# Patient Record
Sex: Male | Born: 2000 | Race: Black or African American | Hispanic: No | Marital: Single | Smoking: Never smoker
Health system: Southern US, Community
[De-identification: ages and names within clinical notes are randomized; demographics above are authoritative.]

## PROBLEM LIST (undated history)

## (undated) DIAGNOSIS — J45909 Unspecified asthma, uncomplicated: Secondary | ICD-10-CM

## (undated) HISTORY — PX: NO PAST SURGERIES: SHX2092

---
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
## 2021-02-09 ENCOUNTER — Other Ambulatory Visit: Payer: Self-pay

## 2021-02-09 MED ORDER — TRIAMCINOLONE ACETONIDE 0.1 % EX OINT: 1.0000 "application " | TOPICAL_OINTMENT | Freq: Two times a day (BID) | CUTANEOUS | 1 refills | Status: AC

## 2021-03-17 ENCOUNTER — Ambulatory Visit (INDEPENDENT_AMBULATORY_CARE_PROVIDER_SITE_OTHER): Payer: 59 | Admitting: Adult Health Nurse Practitioner

## 2021-03-17 ENCOUNTER — Encounter: Payer: Self-pay | Admitting: Adult Health Nurse Practitioner

## 2021-03-17 ENCOUNTER — Other Ambulatory Visit: Payer: Self-pay

## 2021-03-17 VITALS — BP 118/70 | HR 88 | Temp 97.7°F | Wt 207.0 lb

## 2021-03-17 DIAGNOSIS — Z9109 Other allergy status, other than to drugs and biological substances: Secondary | ICD-10-CM

## 2021-03-17 DIAGNOSIS — Z113 Encounter for screening for infections with a predominantly sexual mode of transmission: Secondary | ICD-10-CM | POA: Diagnosis not present

## 2021-03-17 DIAGNOSIS — Z79899 Other long term (current) drug therapy: Secondary | ICD-10-CM

## 2021-03-17 NOTE — Progress Notes (Signed)
Assessment and Plan:  Diagnoses and all orders for this visit:  Environmental allergies Continue singulair Add zyrtec or xyzal daily for drying Take sudafed Q4-6 hours as needed for nasal congestion Saline nasal spray PRN  Screening examination for STD (sexually transmitted disease) -     C. trachomatis/N. gonorrhoeae RNA -     RPR -     HIV Antibody (routine testing w rflx)  Medication management Continued   Contact office with any new or worsening symptoms   Further disposition pending results of labs. Discussed med's effects and SE's.   Over 20 minutes of face to face interview, exam, counseling, chart review, and critical decision making was performed.   No future appointments.  ------------------------------------------------------------------------------------------------------------------   HPI 20 y.o.male presents for evaluation for sinus symptoms that start one week ago.  He reports is started with sinus congestion and sneezing.  Report th e sneezing is multiple times in a row.  He started taking singulair and mucinex and benadryl at night.  He reports these things helped temporarily.  He reports even after the medicine starts working he does not feel that good.    Patient would like to be tested for STI's.  Reports he is in a relationship and just wants to make sure.  He is asymptomatic and denies any known exposure.  Reports he has not been sexually active and denies multiple partners since his last relationship.  Reports his current partner is also getting tested.  Discussed safe sex practices.    Past Medical History:  Diagnosis Date  . Asthma      No Known Allergies  Current Outpatient Medications on File Prior to Visit  Medication Sig  . albuterol (VENTOLIN HFA) 108 (90 Base) MCG/ACT inhaler Inhale 1-2 puffs into the lungs every 6 (six) hours as needed for wheezing or shortness of breath.  Marland Kitchen ibuprofen (ADVIL) 800 MG tablet Take 1 tablet (800 mg total) by  mouth every 8 (eight) hours as needed for moderate pain.  . montelukast (SINGULAIR) 10 MG tablet Take 10 mg by mouth at bedtime.  . triamcinolone ointment (KENALOG) 0.1 % Apply 1 application topically 2 (two) times daily.  . cetirizine (ZYRTEC ALLERGY) 10 MG tablet Take 1 tablet (10 mg total) by mouth at bedtime. (Patient not taking: Reported on 03/17/2021)  . dexamethasone (DECADRON) 4 MG tablet Take 1 tab 3 x day - 3 days, then 2 x day - 3 days, then 1 tab daily   No current facility-administered medications on file prior to visit.    ROS: all negative except above.   Physical Exam:  BP 118/70   Pulse 88   Temp 97.7 F (36.5 C)   Wt 207 lb (93.9 kg)   SpO2 98%   General Appearance: Well nourished, in no apparent distress. Eyes: PERRLA, EOMs, conjunctiva no swelling or erythema Sinuses: No Frontal/maxillary tenderness ENT/Mouth: Ext aud canals clear, TMs without erythema, bulging. No erythema, swelling, or exudate on post pharynx.  Tonsils not swollen or erythematous. Hearing normal.  Neck: Supple, thyroid normal.  Respiratory: Respiratory effort normal, BS equal bilaterally without rales, rhonchi, wheezing or stridor.  Cardio: RRR with no MRGs. Brisk peripheral pulses without edema.  Abdomen: Soft, + BS.  Non tender, no guarding, rebound, hernias, masses. Lymphatics: Non tender without lymphadenopathy.  Musculoskeletal: Full ROM, 5/5 strength, normal gait.  Skin: Warm, dry without rashes, lesions, ecchymosis.  Neuro: Cranial nerves intact. Normal muscle tone, no cerebellar symptoms. Sensation intact.  Psych: Awake and oriented X  3, normal affect, Insight and Judgment appropriate.      Elder Negus, Edrick Oh, DNP [facility] 03/17/2021  3:45 PM

## 2021-03-17 NOTE — Patient Instructions (Addendum)
     Pick up sudafed from any pharmacy.  This is a nasal decongestant.  Add one of the antihistamines from below:  Allergy Symptoms / Runny Nose: Chose one  Zyrtec / Cetirizine Take 10mg  by mouth May cause drowsiness, take nightly Be sure to drink plenty of water If this is not effective, try Xyzal or Allegra  OR  Xyzal / Levocetirazine  Take 5mg  by mouth May cause drowsiness, take nightly Be sure to drink plenty of water If this is not effective try Allegra or Zyrtec  OR  Allegra / fexofenadine Take 180mg  by mouth daily If this is not effective try Zyrtec or Xyzal   *If you battle with chronic allergies you may need to change the antihistamine you currently use to find most effective.   You may continue taking Singular with one of the above antihistamines.   Once your nose is cleared from using sudafed, IF you start to have nasal congestion again, try using Flonase nasal spray daily.

## 2021-03-18 LAB — HIV ANTIBODY (ROUTINE TESTING W REFLEX): HIV 1&2 Ab, 4th Generation: NONREACTIVE

## 2021-03-23 ENCOUNTER — Other Ambulatory Visit: Payer: Self-pay

## 2021-03-23 ENCOUNTER — Encounter: Payer: Self-pay | Admitting: Adult Health

## 2021-03-23 ENCOUNTER — Ambulatory Visit (INDEPENDENT_AMBULATORY_CARE_PROVIDER_SITE_OTHER): Payer: 59 | Admitting: Adult Health

## 2021-03-23 VITALS — BP 110/66 | HR 109 | Temp 97.7°F | Wt 215.0 lb

## 2021-03-23 DIAGNOSIS — J00 Acute nasopharyngitis [common cold]: Secondary | ICD-10-CM

## 2021-03-23 DIAGNOSIS — Z9109 Other allergy status, other than to drugs and biological substances: Secondary | ICD-10-CM

## 2021-03-23 MED ORDER — ALBUTEROL SULFATE HFA 108 (90 BASE) MCG/ACT IN AERS: 1.0000 | INHALATION_SPRAY | Freq: Four times a day (QID) | RESPIRATORY_TRACT | 2 refills | Status: DC | PRN

## 2021-03-23 MED ORDER — FLUTICASONE PROPIONATE 50 MCG/ACT NA SUSP: 1.0000 | Freq: Two times a day (BID) | NASAL | 1 refills | Status: AC | PRN

## 2021-03-23 MED ORDER — PREDNISONE 20 MG PO TABS: ORAL_TABLET | ORAL | 0 refills | Status: DC

## 2021-03-23 NOTE — Patient Instructions (Addendum)
    Try nasal saline sprays/irrigation after coming in from out doors  Try flonase 1-2 sprays in each nostril twice daily as needed - start with at night only   Make sure vents aren't blowing on your face  Be sure to dust and vacuum under bed   Medicines you can use  Nasal congestion  Little Remedies saline spray (aerosol/mist)- can try this, it is in the kids section - pseudoephedrine (Sudafed)- behind the counter, do not use if you have high blood pressure, medicine that have -D in them.  - phenylephrine (Sudafed PE) -Dextormethorphan + chlorpheniramine (Coridcidin HBP)- okay if you have high blood pressure -Oxymetazoline (Afrin) nasal spray- LIMIT to 3 days -Saline nasal spray -Neti pot (used distilled or bottled water)  Ear pain/congestion  -pseudoephedrine (sudafed) - Nasonex/flonase nasal spray  Fever  -Acetaminophen (Tyelnol) -Ibuprofen (Advil, motrin, aleve)  Sore Throat  -Acetaminophen (Tyelnol) -Ibuprofen (Advil, motrin, aleve) -Drink a lot of water -Gargle with salt water - Rest your voice (don't talk) -Throat sprays -Cough drops  Body Aches  -Acetaminophen (Tyelnol) -Ibuprofen (Advil, motrin, aleve)  Headache  -Acetaminophen (Tyelnol) -Ibuprofen (Advil, motrin, aleve) - Exedrin, Exedrin Migraine  Allergy symptoms (cough, sneeze, runny nose, itchy eyes) -Claritin or loratadine cheapest but likely the weakest  -Zyrtec or certizine at night because it can make you sleepy -The strongest is allegra or fexafinadine  Cheapest at [employer], [employer], [employer]  Cough  -Dextromethorphan (Delsym)- medicine that has DM in it -Guafenesin (Mucinex/Robitussin) - cough drops - drink lots of water  Chest Congestion  -Guafenesin (Mucinex/Robitussin)  Red Itchy Eyes  - Naphcon-A  Upset Stomach  - Bland diet (nothing spicy, greasy, fried, and high acid foods like tomatoes, oranges, berries) -OKAY- cereal, bread, soup, crackers, rice -Eat smaller more frequent  meals -reduce caffeine, no alcohol -Loperamide (Imodium-AD) if diarrhea -Prevacid for heart burn  General health when sick  -Hydration -wash your hands frequently -keep surfaces clean -change pillow cases and sheets often -Get fresh air but do not exercise strenuously -Vitamin D, double up on it - Vitamin C -Zinc

## 2021-03-23 NOTE — Progress Notes (Signed)
Assessment and Plan:  Diagnoses and all orders for this visit:  Environmental allergies/ rhinitis Allergic hygiene reviewed Continue singulair, allegra D Will give short steroid taper Recommended saline nasal irrigations after going outdoors, then do flonase at night, PRN if flaring in AM for short term Discussed the importance of avoiding unnecessary antibiotic therapy. Follow up as needed. -     fluticasone (FLONASE) 50 MCG/ACT nasal spray; Place 1-2 sprays into both nostrils 2 (two) times daily as needed for allergies or rhinitis. -     predniSONE (DELTASONE) 20 MG tablet; 2 tablets daily for 3 days, 1 tablet daily for 4 days.   Asthma/wheezing Continue Singulair; PRN inhaler; will do steroid taper and control allergies -     albuterol (VENTOLIN HFA) 108 (90 Base) MCG/ACT inhaler; Inhale 1-2 puffs into the lungs every 6 (six) hours as needed for wheezing or shortness of breath.  Further disposition pending results of labs. Discussed med's effects and SE's.   Over 20 minutes of face to face interview, exam, counseling, chart review, and critical decision making was performed.   No future appointments.  ------------------------------------------------------------------------------------------------------------------   HPI BP 110/66   Pulse (!) 109   Temp 97.7 F (36.5 C)   Wt 215 lb (97.5 kg)   SpO2 96%   20 y.o.male with seasonal allergies presents for follow up on allergies/sinus symptoms. He had covid 30 Dec 2020. Was vaccinated with pfizer, 2/2. Denies known sick contacts.   He was evaluated 03/17/2021, running, nose congestion, was taking singulair, mucinex, benadryl at night; was advised start daily allegra- D, PRN sudafed which did help sx signfiicantly, but presents today feeling worse over the weekend, having more scratchy throat, fatigued, nasal congestion. Denies fever/chills, HA, denies sinus tenderness. Main concern is congestion worse at night. He has hx of allergies,  did have some wheezing a few nights ago. Requests albuterol refill. Denies pets in room, no carpet.   Past Medical History:  Diagnosis Date  . Asthma      No Known Allergies  Current Outpatient Medications on File Prior to Visit  Medication Sig  . cetirizine (ZYRTEC ALLERGY) 10 MG tablet Take 1 tablet (10 mg total) by mouth at bedtime.  Marland Kitchen ibuprofen (ADVIL) 800 MG tablet Take 1 tablet (800 mg total) by mouth every 8 (eight) hours as needed for moderate pain.  . montelukast (SINGULAIR) 10 MG tablet Take 10 mg by mouth at bedtime.  . triamcinolone ointment (KENALOG) 0.1 % Apply 1 application topically 2 (two) times daily.  Marland Kitchen albuterol (VENTOLIN HFA) 108 (90 Base) MCG/ACT inhaler Inhale 1-2 puffs into the lungs every 6 (six) hours as needed for wheezing or shortness of breath. (Patient not taking: Reported on 03/23/2021)  . dexamethasone (DECADRON) 4 MG tablet Take 1 tab 3 x day - 3 days, then 2 x day - 3 days, then 1 tab daily   No current facility-administered medications on file prior to visit.    Past Surgical History:  Procedure Laterality Date  . NO PAST SURGERIES    . TONSILLECTOMY AND ADENOIDECTOMY Bilateral 06/03/2019   Procedure: TONSILLECTOMY AND ADENOIDECTOMY;  Surgeon: Drema Halon, MD;  Location: [facility];  Service: ENT;  Laterality: Bilateral;   Surgical History:  He  has a past surgical history that includes No past surgeries and Tonsillectomy and adenoidectomy (Bilateral, 06/03/2019). Social History:   reports that he has never smoked. He has never used smokeless tobacco. He reports that he does not drink alcohol and does  not use drugs.   ROS: all negative except above.   Physical Exam:  BP 110/66   Pulse (!) 109   Temp 97.7 F (36.5 C)   Wt 215 lb (97.5 kg)   SpO2 96%   General Appearance: Well nourished, well dressed young AA male in no apparent distress. Eyes: PERRLA, EOMs, conjunctiva no swelling or erythema Sinuses: No  Frontal/maxillary tenderness ENT/Mouth: Ext aud canals clear, TMs without erythema, bulging. No erythema, swelling, or exudate on post pharynx. Hearing normal.  Neck: Supple, thyroid normal.  Respiratory: Respiratory effort normal, BS equal bilaterally without rales, rhonchi, wheezing or stridor.  Cardio: RRR with no MRGs.  Lymphatics: Non tender without lymphadenopathy.  Musculoskeletal:  normal gait.  Skin: Warm, dry without rashes, lesions, ecchymosis.  Neuro: Normal muscle tone Psych: Awake and oriented X 3, normal affect, Insight and Judgment appropriate.      Dan Maker, NP 5:52 PM [facility]

## 2021-05-27 ENCOUNTER — Emergency Department: Admit: 2021-05-27 | Discharge status: Absent without leave | Payer: Self-pay

## 2021-05-27 ENCOUNTER — Encounter: Payer: Self-pay | Admitting: Emergency Medicine

## 2021-05-27 ENCOUNTER — Other Ambulatory Visit: Payer: Self-pay

## 2021-05-27 ENCOUNTER — Emergency Department (INDEPENDENT_AMBULATORY_CARE_PROVIDER_SITE_OTHER): Payer: 59

## 2021-05-27 ENCOUNTER — Emergency Department (INDEPENDENT_AMBULATORY_CARE_PROVIDER_SITE_OTHER)
Admission: EM | Admit: 2021-05-27 | Discharge: 2021-05-27 | Disposition: A | Discharge status: Home / Self Care | Payer: 59 | Source: Home / Self Care | Attending: Family Medicine | Admitting: Family Medicine

## 2021-05-27 DIAGNOSIS — M940 Chondrocostal junction syndrome [Tietze]: Secondary | ICD-10-CM

## 2021-05-27 DIAGNOSIS — R079 Chest pain, unspecified: Secondary | ICD-10-CM | POA: Diagnosis not present

## 2021-05-27 NOTE — Discharge Instructions (Addendum)
Apply ice pack for 20 to 30 minutes, 2 to 3 times daily  Continue until pain and swelling decrease.  May take Ibuprofen 200mg , 4 tabs every 8 hours with food.

## 2021-05-27 NOTE — ED Provider Notes (Signed)
[facility]    CSN: [id number] Arrival date & time: 05/27/21  1317      History   Chief Complaint Chief Complaint  Patient presents with   Lump under left breast area    HPI Micheal Taylor is a 20 y.o. male.   Last night patient believes that he felt a lump beneath the skin of his left chest below the breast.  The area is sharp and painful when he palpates it.  The pain is not related to activity.  He recalls no injury.  He denies shortness of breath and pleuritic pain, and feels well otherwise.  The history is provided by the patient.   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 Authorizing Provider  albuterol (VENTOLIN HFA) 108 (90 Base) MCG/ACT inhaler Inhale 1-2 puffs into the lungs every 6 (six) hours as needed for wheezing or shortness of breath. 03/23/21  Yes Judd Gaudier, NP  cetirizine (ZYRTEC ALLERGY) 10 MG tablet Take 1 tablet (10 mg total) by mouth at bedtime. 10/20/20 10/20/21 Yes McClanahan, Bella Kennedy, NP  fluticasone (FLONASE) 50 MCG/ACT nasal spray Place 1-2 sprays into both nostrils 2 (two) times daily as needed for allergies or rhinitis. 03/23/21  Yes Judd Gaudier, NP  ibuprofen (ADVIL) 800 MG tablet Take 1 tablet (800 mg total) by mouth every 8 (eight) hours as needed for moderate pain. 10/20/20  Yes McClanahan, Kyra, NP  montelukast (SINGULAIR) 10 MG tablet Take 10 mg by mouth at  bedtime.   Yes [provider]  triamcinolone ointment (KENALOG) 0.1 % Apply 1 application topically 2 (two) times daily. 02/09/21  Yes McClanahan, Bella Kennedy, NP  predniSONE (DELTASONE) 20 MG tablet 2 tablets daily for 3 days, 1 tablet daily for 4 days. 03/23/21   Judd Gaudier, NP    Family History Family History  Problem Relation Age of Onset   Hypertension Mother    Hypertension Maternal Grandmother    Dementia Maternal Grandfather    Alzheimer's disease Paternal Grandfather     Social History Social History   Tobacco Use   Smoking status: Never   Smokeless tobacco: Never  Vaping Use   Vaping Use: Never used  Substance Use Topics   Alcohol use: No    Alcohol/week: 0.0 standard drinks   Drug use: No     Allergies   Patient has no known allergies.   Review of Systems Review of Systems  Constitutional:  Negative for activity change, appetite change, chills, diaphoresis, fatigue and fever.  HENT: Negative.    Eyes: Negative.   Respiratory:  Negative for cough, choking, chest tightness, shortness of breath, wheezing and stridor.   Cardiovascular:  Negative for palpitations and leg swelling.  Gastrointestinal: Negative.   Genitourinary: Negative.   Musculoskeletal:  Negative for joint swelling and myalgias.  Skin:  Negative for color change and rash.  Neurological:  Negative for headaches.  Hematological:  Negative for adenopathy.    Physical Exam Triage Vital Signs ED Triage Vitals  Enc Vitals Group     BP 05/27/21 1332 117/72     Pulse Rate 05/27/21 1332 78     Resp --      Temp 05/27/21 1332 99 F (37.2 C)     Temp Source 05/27/21 1332 Oral     SpO2 05/27/21 1332 98 %     Weight --      Height --      Head Circumference --      Peak Flow --      Pain Score 05/27/21 1334 6     Pain Loc --      Pain Edu? --      Excl. in GC? --    No data found.  Updated Vital Signs BP 117/72 (BP Location: Right Arm)   Pulse 78   Temp 99 F (37.2 C) (Oral)    SpO2 98%   Visual Acuity Right Eye Distance:   Left Eye Distance:   Bilateral Distance:    Right Eye Near:   Left Eye Near:    Bilateral Near:     Physical Exam Vitals and nursing note reviewed.  Constitutional:      General: He is not in acute distress.    Appearance: He is not ill-appearing.  HENT:     Head: Normocephalic.     Nose: Nose normal.     Mouth/Throat:     Pharynx: Oropharynx is clear.  Eyes:     Pupils: Pupils are equal, round, and reactive to light.  Cardiovascular:     Rate and Rhythm: Normal rate and regular rhythm.     Heart sounds: Normal heart sounds. No murmur heard.   No friction rub.  Pulmonary:     Breath sounds: Normal breath sounds.  Chest:     Chest wall: Tenderness present. No mass, swelling, crepitus or edema.       Comments: Left anterior chest has tenderness to palpation over ribs and intercostal muscles from beneath breast to the sternum as noted on diagram.  No subcutaneous nodule or mass palpated.   Abdominal:     Tenderness: There is no abdominal tenderness.  Musculoskeletal:     Cervical back: Neck supple.     Right lower leg: No edema.     Left lower leg: No edema.  Lymphadenopathy:     Cervical: No cervical adenopathy.  Skin:    General: Skin is warm and dry.     Findings: No rash.  Neurological:     Mental Status: He is alert and oriented to person, place, and time.     UC Treatments / Results  Labs (all labs ordered are listed, but only abnormal results are displayed) Labs Reviewed - No data to display  EKG   Radiology DG Chest 2 View  Result Date: 05/27/2021 CLINICAL DATA:  Left chest pain EXAM: CHEST - 2 VIEW COMPARISON:  06/09/2006 FINDINGS: The heart size and mediastinal contours are within normal limits. Both lungs are clear. The visualized skeletal structures are unremarkable. IMPRESSION: No active cardiopulmonary disease. Electronically Signed   By: Marlan Palau M.D.   On: 05/27/2021 14:36     Procedures Procedures (including critical care time)  Medications Ordered in UC Medications - No data to display  Initial Impression / Assessment and Plan / UC Course  I have reviewed the triage vital signs and the nursing notes.  Pertinent labs & imaging results  that were available during my care of the patient were reviewed by me and considered in my medical decision making (see chart for details).    Reassurance. Followup with Dr. Rodney [facility] ([facility]) if not improving about four weeks.   Final Clinical Impressions(s) / UC Diagnoses   Final diagnoses:  Costochondritis     Discharge Instructions      Apply ice pack for 20 to 30 minutes, 2 to 3 times daily  Continue until pain and swelling decrease.  May take Ibuprofen 200mg , 4 tabs every 8 hours with food.      ED Prescriptions   None       , MD 05/30/21 1042

## 2021-05-27 NOTE — ED Triage Notes (Signed)
Patient c/o feeling a lump under his left breast area.  Patient noticed it last night.  The area is painful, it comes and goes.  No injury to the area.

## 2021-07-20 NOTE — Progress Notes (Addendum)
Complete Physical  Assessment and Plan: Health Maintenance- Discussed STD testing, safe sex, alcohol and drug awareness, drinking and driving dangers, wearing a seat belt and general safety measures for young adult. Micheal Taylor was seen today for annual exam.  Diagnoses and all orders for this visit:  Encounter for general medical examination       - Due annually  Screening examination for STD (sexually transmitted disease) -     C. trachomatis/N. gonorrhoeae RNA -     HIV Antibody (routine testing w rflx) -     RPR  Screening for cardiovascular condition -     EKG 12-Lead  Screening, anemia, deficiency, iron -     CBC with Differential/Platelet  Screening for thyroid disorder -     TSH  Vitamin D deficiency -     Insurance does not cover a Vit D blood test.  Instructed to take Vit D3 5000 units daily  Screening, lipid -     Lipid panel  Screening for endocrine, metabolic and immunity disorder -     COMPLETE METABOLIC PANEL WITH GFR  Class 2 obesity        - Instructed to increase exercise, eat diet rich in fruits /vegetables/fiber, limit processed foods   Discussed med's effects and SE's. Screening labs and tests as requested with regular follow-up as recommended. Over 40 minutes of exam, counseling, chart review and critical decision making was performed   HPI  This very nice 20 y.o.male presents for complete physical.  Patient has no major health issues.  Patient reports no complaints at this time.  BMI is Body mass index is 36.51 kg/m., he has been working on diet and exercise. Wt Readings from Last 3 Encounters:  07/21/21 222 lb 12.8 oz (101.1 kg) (97 %, Z= 1.94)*  03/23/21 215 lb (97.5 kg) (96 %, Z= 1.81)*  03/17/21 207 lb (93.9 kg) (95 %, Z= 1.64)*   * Growth percentiles are based on CDC (Boys, 2-20 Years) data.    He does workout. Pt works at [employer] in [employer] 5 days a week Finally, patient has history of Vitamin D Deficiency and last vitamin D was  Lab  Results  Component Value Date   VD25OH 18 (L) 07/24/2015  .  Currently on supplementation  Current Medications:  Current Outpatient Medications on File Prior to Visit  Medication Sig Dispense Refill   albuterol (VENTOLIN HFA) 108 (90 Base) MCG/ACT inhaler Inhale 1-2 puffs into the lungs every 6 (six) hours as needed for wheezing or shortness of breath. 18 g 2   cetirizine (ZYRTEC ALLERGY) 10 MG tablet Take 1 tablet (10 mg total) by mouth at bedtime. 30 tablet 2   fluticasone (FLONASE) 50 MCG/ACT nasal spray Place 1-2 sprays into both nostrils 2 (two) times daily as needed for allergies or rhinitis. 16 g 1   ibuprofen (ADVIL) 800 MG tablet Take 1 tablet (800 mg total) by mouth every 8 (eight) hours as needed for moderate pain. 30 tablet 0   montelukast (SINGULAIR) 10 MG tablet Take 10 mg by mouth at bedtime.     predniSONE (DELTASONE) 20 MG tablet 2 tablets daily for 3 days, 1 tablet daily for 4 days. 10 tablet 0   triamcinolone ointment (KENALOG) 0.1 % Apply 1 application topically 2 (two) times daily. (Patient taking differently: Apply 1 application topically 2 (two) times daily. Using PRN) 80 g 1   No current facility-administered medications on file prior to visit.   Health Maintenance:   Immunization History  Administered Date(s) Administered   DTaP 05/03/2002, 07/12/2002, 09/13/2002, 06/18/2003, 04/03/2006   HPV 9-valent 10/10/2016, 11/29/2016, 04/05/2017   Hepatitis B 09/22/2002, 04/01/2002, 09/13/2002   HiB (PRP-OMP) 05/03/2002, 07/22/2002, 10/10/2002, 03/19/2003   IPV 05/03/2002, 07/12/2002, 12/18/2002, 04/03/2006   Influenza Inj Mdck Quad With Preservative 11/06/2017   Influenza Split 09/30/2014   Influenza, Seasonal, Injecte, Preservative Fre 10/10/2016   MMR 03/19/2003, 04/28/2005   Meningococcal Conjugate 07/01/2014   PFIZER(Purple Top)SARS-COV-2 Vaccination 02/20/2020, 03/16/2020   Pneumococcal-Unspecified 05/03/2002, 09/13/2002   Tdap 03/15/2013   Varicella 03/19/2003     TD/TDAP:2014 Influenza: 2018 Pneumovax: N/A HPV vaccines: 3/3 Covid: [place] 02/20/20, 03/16/20 Sexually Active: yes STD testing today Last Dental Exam: 2021 Dr. Orma Render dental Last Eye Exam None - no glasses  Allergies: No Known Allergies Medical History:  has Childhood asthma; Eczema; Environmental allergies; and Acne on their problem list. Surgical History:  He  has a past surgical history that includes No past surgeries and Tonsillectomy and adenoidectomy (Bilateral, 06/03/2019). Family History:  Hisfamily history includes Alzheimer's disease in his paternal grandfather; Dementia in his maternal grandfather; Hypertension in his maternal grandmother and mother. Social History:   reports that he has never smoked. He has never used smokeless tobacco. He reports that he does not drink alcohol and does not use drugs.  Review of Systems: Review of Systems  Constitutional:  Negative for chills and fever.  HENT:  Negative for congestion, hearing loss, nosebleeds, sinus pain and tinnitus.   Eyes:  Negative for blurred vision and double vision.  Respiratory:  Negative for cough, shortness of breath and wheezing.   Cardiovascular:  Negative for chest pain, palpitations, orthopnea and leg swelling.  Gastrointestinal:  Negative for abdominal pain, constipation, heartburn, nausea and vomiting.  Genitourinary:  Negative for dysuria and urgency.  Skin:  Negative for rash.  Neurological:  Negative for dizziness, tingling, weakness and headaches.  Endo/Heme/Allergies:  Does not bruise/bleed easily.  Psychiatric/Behavioral:  Negative for depression and suicidal ideas. The patient does not have insomnia.    Physical Exam: Estimated body mass index is 36.51 kg/m as calculated from the following:   Height as of this encounter: 5' 5.5" (1.664 m).   Weight as of this encounter: 222 lb 12.8 oz (101.1 kg). BP 120/62   Pulse 67   Temp 97.7 F (36.5 C)   Ht 5' 5.5" (1.664 m)   Wt 222 lb 12.8 oz  (101.1 kg)   SpO2 98%   BMI 36.51 kg/m  General Appearance: Well nourished, in no apparent distress.  Eyes: PERRLA, EOMs, conjunctiva no swelling or erythema, normal fundi and vessels.  Sinuses: No Frontal/maxillary tenderness  ENT/Mouth: Ext aud canals clear, normal light reflex with TMs without erythema, bulging. Good dentition. No erythema, swelling, or exudate on post pharynx. Tonsils not swollen or erythematous. Hearing normal.  Neck: Supple, thyroid normal. No bruits  Respiratory: Respiratory effort normal, BS equal bilaterally without rales, rhonchi, wheezing or stridor.  Cardio: RRR without murmurs, rubs or gallops. Brisk peripheral pulses without edema.  Chest: symmetric, with normal excursions and percussion.  Abdomen: Soft, nontender, no guarding, rebound, hernias, masses, or organomegaly.  Lymphatics: Non tender without lymphadenopathy.  Genitourinary: defer Musculoskeletal: Full ROM all peripheral extremities,5/5 strength, and normal gait.  Skin: Warm, dry without rashes, lesions, ecchymosis. Neuro: Cranial nerves intact, reflexes equal bilaterally. Normal muscle tone, no cerebellar symptoms. Sensation intact.  Psych: Awake and oriented X 3, normal affect, Insight and Judgment appropriate.   EKG: Normal sinus rhythm  Eva Vallee W Denna Fryberger 2:15  PM [facility]

## 2021-07-21 ENCOUNTER — Encounter: Payer: Self-pay | Admitting: Nurse Practitioner

## 2021-07-21 ENCOUNTER — Ambulatory Visit (INDEPENDENT_AMBULATORY_CARE_PROVIDER_SITE_OTHER): Payer: 59 | Admitting: Nurse Practitioner

## 2021-07-21 ENCOUNTER — Other Ambulatory Visit: Payer: Self-pay

## 2021-07-21 VITALS — BP 120/62 | HR 67 | Temp 97.7°F | Ht 65.5 in | Wt 222.8 lb

## 2021-07-21 DIAGNOSIS — Z13220 Encounter for screening for lipoid disorders: Secondary | ICD-10-CM

## 2021-07-21 DIAGNOSIS — I10 Essential (primary) hypertension: Secondary | ICD-10-CM

## 2021-07-21 DIAGNOSIS — Z113 Encounter for screening for infections with a predominantly sexual mode of transmission: Secondary | ICD-10-CM

## 2021-07-21 DIAGNOSIS — Z136 Encounter for screening for cardiovascular disorders: Secondary | ICD-10-CM

## 2021-07-21 DIAGNOSIS — E6609 Other obesity due to excess calories: Secondary | ICD-10-CM

## 2021-07-21 DIAGNOSIS — Z1329 Encounter for screening for other suspected endocrine disorder: Secondary | ICD-10-CM

## 2021-07-21 DIAGNOSIS — Z130 Encounter for screening for diseases of the blood and blood-forming organs and certain disorders involving the immune mechanism: Secondary | ICD-10-CM

## 2021-07-21 DIAGNOSIS — Z6836 Body mass index (BMI) 36.0-36.9, adult: Secondary | ICD-10-CM

## 2021-07-21 DIAGNOSIS — E559 Vitamin D deficiency, unspecified: Secondary | ICD-10-CM

## 2021-07-21 DIAGNOSIS — Z0000 Encounter for general adult medical examination without abnormal findings: Secondary | ICD-10-CM

## 2021-07-21 NOTE — Patient Instructions (Signed)

## 2021-07-21 NOTE — Addendum Note (Signed)
Addended by: Revonda Humphrey on: 07/21/2021 03:39 PM   Modules accepted: Orders

## 2021-07-22 LAB — COMPLETE METABOLIC PANEL WITH GFR
AG Ratio: 1.8 (calc) (ref 1.0–2.5)
ALT: 31 U/L (ref 8–46)
AST: 20 U/L (ref 12–32)
Albumin: 4.6 g/dL (ref 3.6–5.1)
Alkaline phosphatase (APISO): 54 U/L (ref 46–169)
BUN: 16 mg/dL (ref 7–20)
CO2: 27 mmol/L (ref 20–32)
Calcium: 9.8 mg/dL (ref 8.9–10.4)
Chloride: 103 mmol/L (ref 98–110)
Creat: 1.02 mg/dL (ref 0.60–1.24)
Globulin: 2.5 g/dL (calc) (ref 2.1–3.5)
Glucose, Bld: 100 mg/dL — ABNORMAL HIGH (ref 65–99)
Potassium: 4.4 mmol/L (ref 3.8–5.1)
Sodium: 138 mmol/L (ref 135–146)
Total Bilirubin: 0.9 mg/dL (ref 0.2–1.1)
Total Protein: 7.1 g/dL (ref 6.3–8.2)
eGFR: 109 mL/min/{1.73_m2} (ref >=60)

## 2021-07-22 LAB — CBC WITH DIFFERENTIAL/PLATELET
Absolute Monocytes: 489 cells/uL (ref 200–950)
Basophils Absolute: 31 cells/uL (ref 0–200)
Basophils Relative: 0.6 %
Eosinophils Absolute: 317 cells/uL (ref 15–500)
Eosinophils Relative: 6.1 %
HCT: 48.9 % (ref 38.5–50.0)
Hemoglobin: 16.3 g/dL (ref 13.2–17.1)
Lymphs Abs: 1695 cells/uL (ref 850–3900)
MCH: 29.4 pg (ref 27.0–33.0)
MCHC: 33.3 g/dL (ref 32.0–36.0)
MCV: 88.3 fL (ref 80.0–100.0)
MPV: 10.6 fL (ref 7.5–12.5)
Monocytes Relative: 9.4 %
Neutro Abs: 2668 cells/uL (ref 1500–7800)
Neutrophils Relative %: 51.3 %
Platelets: 294 10*3/uL (ref 140–400)
RBC: 5.54 10*6/uL (ref 4.20–5.80)
RDW: 12.7 % (ref 11.0–15.0)
Total Lymphocyte: 32.6 %
WBC: 5.2 10*3/uL (ref 3.8–10.8)

## 2021-07-22 LAB — LIPID PANEL
Cholesterol: 184 mg/dL — ABNORMAL HIGH (ref <170)
HDL: 55 mg/dL (ref >45)
LDL Cholesterol (Calc): 109 mg/dL (calc) (ref <110)
Non-HDL Cholesterol (Calc): 129 mg/dL (calc) — ABNORMAL HIGH (ref <120)
Total CHOL/HDL Ratio: 3.3 (calc) (ref <5.0)
Triglycerides: 103 mg/dL — ABNORMAL HIGH (ref <90)

## 2021-07-22 LAB — HIV ANTIBODY (ROUTINE TESTING W REFLEX): HIV 1&2 Ab, 4th Generation: NONREACTIVE

## 2021-07-22 LAB — C. TRACHOMATIS/N. GONORRHOEAE RNA
C. trachomatis RNA, TMA: NOT DETECTED
N. gonorrhoeae RNA, TMA: NOT DETECTED

## 2021-07-22 LAB — RPR: RPR Ser Ql: NONREACTIVE

## 2021-07-22 LAB — TSH: TSH: 0.53 mIU/L (ref 0.50–4.30)

## 2021-07-29 ENCOUNTER — Encounter: Payer: 59 | Admitting: Nurse Practitioner

## 2021-08-27 ENCOUNTER — Encounter: Payer: Self-pay | Admitting: Adult Health

## 2021-08-27 ENCOUNTER — Other Ambulatory Visit: Payer: Self-pay

## 2021-08-27 ENCOUNTER — Ambulatory Visit: Payer: 59 | Admitting: Adult Health

## 2021-08-27 VITALS — BP 112/70 | HR 94 | Temp 97.5°F | Resp 17 | Ht 65.5 in | Wt 220.8 lb

## 2021-08-27 DIAGNOSIS — Z1152 Encounter for screening for COVID-19: Secondary | ICD-10-CM | POA: Diagnosis not present

## 2021-08-27 DIAGNOSIS — J029 Acute pharyngitis, unspecified: Secondary | ICD-10-CM | POA: Diagnosis not present

## 2021-08-27 LAB — POC COVID19 BINAXNOW: SARS Coronavirus 2 Ag: NEGATIVE

## 2021-08-27 MED ORDER — PREDNISONE 20 MG PO TABS
ORAL_TABLET | ORAL | 0 refills | Status: DC
Start: 2021-08-27 — End: 2021-10-13

## 2021-08-27 MED ORDER — PROMETHAZINE-DM 6.25-15 MG/5ML PO SYRP: 5.0000 mL | ORAL_SOLUTION | Freq: Four times a day (QID) | ORAL | 1 refills | Status: DC | PRN

## 2021-08-27 NOTE — Patient Instructions (Signed)
  For sleep try 25-50 mg of benadryl 1 hour prior to bedtime as needed If this doesn't help try melatonin 5-15 mg      -Make sure you are drinking plenty of fluids to stay hydrated.    -while drinking fluids pinch and hold nose close and swallow, to help open eustachian tubes and drain fluid behind ear drums. -you can do salt water gargles. You can also do 1 TSP liquid Maalox and 1 TSP liquid benadryl- mix/ gargle/ spit  If you are not feeling better in 10-14 days, then please call the office.  Pharyngitis Pharyngitis is redness, pain, and swelling (inflammation) of your pharynx.   CAUSES   Pharyngitis is usually caused by infection. Most of the time, these infections are from viruses (viral) and are part of a cold. However, sometimes pharyngitis is caused by bacteria (bacterial). Pharyngitis can also be caused by allergies. Viral pharyngitis may be spread from person to person by coughing, sneezing, and personal items or utensils (cups, forks, spoons, toothbrushes). Bacterial pharyngitis may be spread from person to person by more intimate contact, such as kissing.   SIGNS AND SYMPTOMS   Symptoms of pharyngitis include:   Sore throat.   Tiredness (fatigue).   Low-grade fever.   Headache. Joint pain and muscle aches. Skin rashes. Swollen lymph nodes. Plaque-like film on throat or tonsils (often seen with bacterial pharyngitis). DIAGNOSIS   Your health care provider will ask you questions about your illness and your symptoms. Your medical history, along with a physical exam, is often all that is needed to diagnose pharyngitis. Sometimes, a rapid strep test is done. Other lab tests may also be done, depending on the suspected cause.   TREATMENT   Viral pharyngitis will usually get better in 3-4 days without the use of medicine. Bacterial pharyngitis is treated with medicines that kill germs (antibiotics).   HOME CARE INSTRUCTIONS   Drink enough water and fluids to keep your urine clear  or pale yellow.   Only take over-the-counter or prescription medicines as directed by your health care provider:   If you are prescribed antibiotics, make sure you finish them even if you start to feel better.   Do not take aspirin.   Get lots of rest.   Gargle with 8 oz of salt water ( tsp of salt per 1 qt of water) as often as every 1-2 hours to soothe your throat.   Throat lozenges (if you are not at risk for choking) or sprays may be used to soothe your throat. SEEK MEDICAL CARE IF:   You have large, tender lumps in your neck. You have a rash. You cough up green, yellow-brown, or bloody spit. SEEK IMMEDIATE MEDICAL CARE IF:   Your neck becomes stiff. You drool or are unable to swallow liquids. You vomit or are unable to keep medicines or liquids down. You have severe pain that does not go away with the use of recommended medicines. You have trouble breathing (not caused by a stuffy nose). MAKE SURE YOU:   Understand these instructions. Will watch your condition. Will get help right away if you are not doing well or get worse. Document Released: 11/28/2005 Document Revised: 09/18/2013 Document Reviewed: 08/05/2013 [facility] Patient Information 2015 [place], [place]. This information is not intended to replace advice given to you by your health care provider. Make sure you discuss any questions you have with your health care provider.

## 2021-08-27 NOTE — Progress Notes (Signed)
Assessment and Plan:  Micheal Taylor was seen today for sore throat.  Diagnoses and all orders for this visit:  Acute pharyngitis, unspecified etiology Bengin exam, suggestive of viral etiology Discussed importance of avoiding unnecessary abx Pharyngitis: increase fluids,  Salt water gargles, throat lozenges Start steroid PRN only if tylenol/ibuprofen and above insufficient for pain management over the weekend  Immune support reviewed; increase vitamin C, avoid sugar and dairy If symptoms do not improve in 5-7 days or get worse (fever, HA, very red throat/white spots) contact the office or go to UC.  -     POC COVID-Micheal - negative  -     predniSONE (DELTASONE) 20 MG tablet; 2 tablets daily for 3 days, 1 tablet daily for 4 days. -     promethazine-dextromethorphan (PROMETHAZINE-DM) 6.25-15 MG/5ML syrup; Take 5 mLs by mouth 4 (four) times daily as needed for cough.  Discussed med's effects and SE's.   Over 20 minutes of exam, counseling, chart review, and critical decision making was performed.   Future Appointments  Date Time Provider Department Center  07/21/2022  2:00 PM Revonda Humphrey, NP [facility] None    ------------------------------------------------------------------------------------------------------------------   HPI BP 112/70   Pulse 94   Temp (!) 97.5 F (36.4 C)   Resp 17   Ht 5' 5.5" (1.664 m)   Wt 220 lb 12.8 oz (100.2 kg)   SpO2 96%   BMI 36.18 kg/m  Micheal Taylor presents for evaluation of sore throat.   Scratchy throat started yesterday, sore towards the evening, denies fever, endorses mild chills. Denies HA, nasal congestion, rhinitis, watery/itchy eyes, sneezing.   Typically spring allergies   He reports has taken ibuprofen/tylenol and throat lozenges with mild improvement  No known sick contacts  Did have covid Micheal vaccines 2/2 in 2021, had covid Micheal early 2022, hasn't had booster yet.   S/p tonsillectomy    Past Medical History:  Diagnosis Date   Asthma       No Known Allergies  Current Outpatient Medications on File Prior to Visit  Medication Sig   albuterol (VENTOLIN HFA) 108 (90 Base) MCG/ACT inhaler Inhale 1-2 puffs into the lungs every 6 (six) hours as needed for wheezing or shortness of breath.   cetirizine (ZYRTEC ALLERGY) 10 MG tablet Take 1 tablet (10 mg total) by mouth at bedtime.   fluticasone (FLONASE) 50 MCG/ACT nasal spray Place 1-2 sprays into both nostrils 2 (two) times daily as needed for allergies or rhinitis.   ibuprofen (ADVIL) 800 MG tablet Take 1 tablet (800 mg total) by mouth every 8 (eight) hours as needed for moderate pain.   montelukast (SINGULAIR) 10 MG tablet Take 10 mg by mouth at bedtime.   triamcinolone ointment (KENALOG) 0.1 % Apply 1 application topically 2 (two) times daily. (Patient taking differently: Apply 1 application topically 2 (two) times daily. Using PRN)   predniSONE (DELTASONE) 20 MG tablet 2 tablets daily for 3 days, 1 tablet daily for 4 days. (Patient not taking: Reported on 08/27/2021)   No current facility-administered medications on file prior to visit.    ROS: all negative except above.   Physical Exam:  BP 112/70   Pulse 94   Temp (!) 97.5 F (36.4 C)   Resp 17   Ht 5' 5.5" (1.664 m)   Wt 220 lb 12.8 oz (100.2 kg)   SpO2 96%   BMI 36.18 kg/m   General Appearance: Well nourished, well dressed young adults male in no apparent distress. Eyes: PERRLA, conjunctiva  no swelling or erythema Sinuses: No Frontal/maxillary tenderness ENT/Mouth: Ext aud canals clear, TMs without erythema, bulging. No erythema, swelling, or exudate on post pharynx.  Tonsils absent. Hearing normal. No hoarseness.  Neck: Supple, thyroid normal.  Respiratory: Respiratory effort normal, BS equal bilaterally without rales, rhonchi, wheezing or stridor.  Cardio: RRR with no MRGs. Brisk peripheral pulses without edema.  Abdomen: Soft, + BS.  Non tender. Lymphatics: Non tender without lymphadenopathy.   Musculoskeletal: normal gait.  Skin: Warm, dry without rashes, lesions, ecchymosis.  Neuro: Normal muscle tone Psych: Awake and oriented X 3, normal affect, Insight and Judgment appropriate.     Micheal Maker, NP 10:45 AM [facility]

## 2021-10-12 NOTE — Progress Notes (Signed)
Assessment and Plan:   Micheal Taylor was seen today for acute visit.  Diagnoses and all orders for this visit:  Encounter for screening for COVID-19 -     POC COVID-19  Flu-like symptoms -     POCT Influenza A/B  Flu -     oseltamivir (TAMIFLU) 75 MG capsule; Take 1 capsule (75 mg total) by mouth 2 (two) times daily for 10 days. - Discussed the importance of avoiding unnecessary antibiotic therapy, no sign of pneumonia or other bacterial infection Suggested symptomatic OTC remedies, immune support Push fluids - likely dehydrated - alternate water and electrolyte supplemented drink if not eating Tylenol/ibuprofen for fever/headaches Present to ER if fever uncontrolled above 101 despite medication, dyspnea, CP, new or worsening symptoms Stay out for work until 24-48 hours after fever resolves - expect 5-7 day duration Follow up as needed.   Further disposition pending results of labs. Discussed med's effects and SE's.   Over 20 minutes of exam, counseling, chart review, and critical decision making was performed.   Future Appointments  Date Time Provider Department Center  07/21/2022  2:00 PM Revonda Humphrey, NP [facility] None    ------------------------------------------------------------------------------------------------------------------   HPI BP 112/68   Pulse (!) 112   Temp 99 F (37.2 C)   SpO2 95%  20 y.o.male presents for congestion.    States starting 2 days ago he began having body aches, congestion, nasal drainage-  brown, fever and diarrhea.  He has not used anything over the counter besides ibuprofen. Denies nausea, vomiting, and sore throat.    No Known Allergies  Current Outpatient Medications on File Prior to Visit  Medication Sig   albuterol (VENTOLIN HFA) 108 (90 Base) MCG/ACT inhaler Inhale 1-2 puffs into the lungs every 6 (six) hours as needed for wheezing or shortness of breath.   cetirizine (ZYRTEC ALLERGY) 10 MG tablet Take 1 tablet (10 mg total) by  mouth at bedtime.   fluticasone (FLONASE) 50 MCG/ACT nasal spray Place 1-2 sprays into both nostrils 2 (two) times daily as needed for allergies or rhinitis.   ibuprofen (ADVIL) 800 MG tablet Take 1 tablet (800 mg total) by mouth every 8 (eight) hours as needed for moderate pain.   montelukast (SINGULAIR) 10 MG tablet Take 10 mg by mouth at bedtime.   triamcinolone ointment (KENALOG) 0.1 % Apply 1 application topically 2 (two) times daily. (Patient taking differently: Apply 1 application topically 2 (two) times daily. Using PRN)   predniSONE (DELTASONE) 20 MG tablet 2 tablets daily for 3 days, 1 tablet daily for 4 days. (Patient not taking: Reported on 10/13/2021)   promethazine-dextromethorphan (PROMETHAZINE-DM) 6.25-15 MG/5ML syrup Take 5 mLs by mouth 4 (four) times daily as needed for cough. (Patient not taking: Reported on 10/13/2021)   No current facility-administered medications on file prior to visit.    ROS: all negative except above.   Physical Exam:  BP 112/68   Pulse (!) 112   Temp 99 F (37.2 C)   SpO2 95%   General Appearance: Well nourished, in no apparent distress. Eyes: PERRLA, EOMs, conjunctiva no swelling or erythema Sinuses: No Frontal/maxillary tenderness ENT/Mouth: Ext aud canals clear, TMs without erythema, bulging. Erythema of post pharynx no exudate.  Tonsils not swollen or erythematous. Hearing normal.  Neck: Supple, thyroid normal.  Respiratory: Respiratory effort normal, BS equal bilaterally without rales, rhonchi, wheezing or stridor.  Cardio: RRR with no MRGs. Brisk peripheral pulses without edema.  Abdomen: Soft, + BS.  Non tender, no guarding, rebound,  hernias, masses. Lymphatics: Non tender without lymphadenopathy.  Musculoskeletal: Full ROM, 5/5 strength, normal gait.  Skin: Warm, dry without rashes, lesions, ecchymosis.  Neuro: Cranial nerves intact. Normal muscle tone, no cerebellar symptoms. Sensation intact.  Psych: Awake and oriented X 3, normal  affect, Insight and Judgment appropriate.     Revonda Humphrey, NP 9:19 AM [facility]

## 2021-10-13 ENCOUNTER — Ambulatory Visit (INDEPENDENT_AMBULATORY_CARE_PROVIDER_SITE_OTHER): Payer: 59 | Admitting: Nurse Practitioner

## 2021-10-13 ENCOUNTER — Encounter: Payer: Self-pay | Admitting: Nurse Practitioner

## 2021-10-13 ENCOUNTER — Other Ambulatory Visit: Payer: Self-pay

## 2021-10-13 VITALS — BP 112/68 | HR 112 | Temp 99.0°F

## 2021-10-13 DIAGNOSIS — J111 Influenza due to unidentified influenza virus with other respiratory manifestations: Secondary | ICD-10-CM | POA: Diagnosis not present

## 2021-10-13 DIAGNOSIS — R6889 Other general symptoms and signs: Secondary | ICD-10-CM

## 2021-10-13 DIAGNOSIS — Z1152 Encounter for screening for COVID-19: Secondary | ICD-10-CM | POA: Diagnosis not present

## 2021-10-13 DIAGNOSIS — J45909 Unspecified asthma, uncomplicated: Secondary | ICD-10-CM

## 2021-10-13 LAB — POCT INFLUENZA A/B
Influenza A, POC: POSITIVE — AB
Influenza B, POC: NEGATIVE

## 2021-10-13 LAB — POC COVID19 BINAXNOW: SARS Coronavirus 2 Ag: NEGATIVE

## 2021-10-13 MED ORDER — ALBUTEROL SULFATE HFA 108 (90 BASE) MCG/ACT IN AERS: 1.0000 | INHALATION_SPRAY | Freq: Four times a day (QID) | RESPIRATORY_TRACT | 2 refills | Status: AC | PRN

## 2021-10-13 MED ORDER — OSELTAMIVIR PHOSPHATE 75 MG PO CAPS: 75.0000 mg | ORAL_CAPSULE | Freq: Two times a day (BID) | ORAL | 0 refills | Status: AC

## 2021-10-13 NOTE — Patient Instructions (Signed)
Influenza, Adult °Influenza, also called "the flu," is a viral infection that mainly affects the respiratory tract. This includes the lungs, nose, and throat. The flu spreads easily from person to person (is contagious). It causes common cold symptoms, along with high fever and body aches. °What are the causes? °This condition is caused by the influenza virus. You can get the virus by: °Breathing in droplets that are in the air from an infected person's cough or sneeze. °Touching something that has the virus on it (has been contaminated) and then touching your mouth, nose, or eyes. °What increases the risk? °The following factors may make you more likely to get the flu: °Not washing or sanitizing your hands often. °Having close contact with many people during cold and flu season. °Touching your mouth, eyes, or nose without first washing or sanitizing your hands. °Not getting an annual flu shot. °You may have a higher risk for the flu, including serious problems, such as a lung infection (pneumonia), if you: °Are older than 65. °Are pregnant. °Have a weakened disease-fighting system (immune system). This includes people who have HIV or AIDS, are on chemotherapy, or are taking medicines that reduce (suppress) the immune system. °Have a long-term (chronic) illness, such as heart disease, kidney disease, diabetes, or lung disease. °Have a liver disorder. °Are severely overweight (morbidly obese). °Have anemia. °Have asthma. °What are the signs or symptoms? °Symptoms of this condition usually begin suddenly and last 4-14 days. These may include: °Fever and chills. °Headaches, body aches, or muscle aches. °Sore throat. °Cough. °Runny or stuffy (congested) nose. °Chest discomfort. °Poor appetite. °Weakness or fatigue. °Dizziness. °Nausea or vomiting. °How is this diagnosed? °This condition may be diagnosed based on: °Your symptoms and medical history. °A physical exam. °Swabbing your nose or throat and testing the fluid  for the influenza virus. °How is this treated? °If the flu is diagnosed early, you can be treated with antiviral medicine that is given by mouth (orally) or through an IV. This can help reduce how severe the illness is and how long it lasts. °Taking care of yourself at home can help relieve symptoms. Your health care provider may recommend: °Taking over-the-counter medicines. °Drinking plenty of fluids. °In many cases, the flu goes away on its own. If you have severe symptoms or complications, you may be treated in a hospital. °Follow these instructions at home: °Activity °Rest as needed and get plenty of sleep. °Stay home from work or school as told by your health care provider. Unless you are visiting your health care provider, avoid leaving home until your fever has been gone for 24 hours without taking medicine. °Eating and drinking °Take an oral rehydration solution (ORS). This is a drink that is sold at pharmacies and retail stores. °Drink enough fluid to keep your urine pale yellow. °Drink clear fluids in small amounts as you are able. Clear fluids include water, ice chips, fruit juice mixed with water, and low-calorie sports drinks. °Eat bland, easy-to-digest foods in small amounts as you are able. These foods include bananas, applesauce, rice, lean meats, toast, and crackers. °Avoid drinking fluids that contain a lot of sugar or caffeine, such as energy drinks, regular sports drinks, and soda. °Avoid alcohol. °Avoid spicy or fatty foods. °General instructions °  °Take over-the-counter and prescription medicines only as told by your health care provider. °Use a cool mist humidifier to add humidity to the air in your home. This can make it easier to breathe. °When using a cool mist humidifier,   clean it daily. Empty the water and replace it with clean water. °Cover your mouth and nose when you cough or sneeze. °Wash your hands with soap and water often and for at least 20 seconds, especially after you cough or  sneeze. If soap and water are not available, use alcohol-based hand sanitizer. °Keep all follow-up visits. This is important. °How is this prevented? ° °Get an annual flu shot. This is usually available in late summer, fall, or winter. Ask your health care provider when you should get your flu shot. °Avoid contact with people who are sick during cold and flu season. This is generally fall and winter. °Contact a health care provider if: °You develop new symptoms. °You have: °Chest pain. °Diarrhea. °A fever. °Your cough gets worse. °You produce more mucus. °You feel nauseous or you vomit. °Get help right away if you: °Develop shortness of breath or have difficulty breathing. °Have skin or nails that turn a bluish color. °Have severe pain or stiffness in your neck. °Develop a sudden headache or sudden pain in your face or ear. °Cannot eat or drink without vomiting. °These symptoms may represent a serious problem that is an emergency. Do not wait to see if the symptoms will go away. Get medical help right away. Call your local emergency services (911 in the [place] Do not drive yourself to the hospital. °Summary °Influenza, also called "the flu," is a viral infection that primarily affects your respiratory tract. °Symptoms of the flu usually begin suddenly and last 4-14 days. °Getting an annual flu shot is the best way to prevent getting the flu. °Stay home from work or school as told by your health care provider. Unless you are visiting your health care provider, avoid leaving home until your fever has been gone for 24 hours without taking medicine. °Keep all follow-up visits. This is important. °This information is not intended to replace advice given to you by your health care provider. Make sure you discuss any questions you have with your health care provider. °Document Revised: 07/17/2020 Document Reviewed: 07/17/2020 °Elsevier Patient Education © 2022 [employer] ° °

## 2022-01-21 ENCOUNTER — Other Ambulatory Visit: Payer: Self-pay

## 2022-01-21 ENCOUNTER — Emergency Department
Admission: RE | Admit: 2022-01-21 | Discharge: 2022-01-21 | Disposition: A | Discharge status: Home / Self Care | Payer: 59 | Source: Ambulatory Visit

## 2022-01-21 ENCOUNTER — Emergency Department (INDEPENDENT_AMBULATORY_CARE_PROVIDER_SITE_OTHER): Payer: 59

## 2022-01-21 VITALS — BP 135/66 | HR 68 | Temp 98.4°F | Resp 16

## 2022-01-21 DIAGNOSIS — M25512 Pain in left shoulder: Secondary | ICD-10-CM | POA: Diagnosis not present

## 2022-01-21 MED ORDER — PREDNISONE 20 MG PO TABS: ORAL_TABLET | ORAL | 0 refills | Status: DC

## 2022-01-21 MED ORDER — BACLOFEN 10 MG PO TABS: 10.0000 mg | ORAL_TABLET | Freq: Three times a day (TID) | ORAL | 0 refills | Status: DC

## 2022-01-21 NOTE — Discharge Instructions (Addendum)
Advised patient to take medication as directed with food to completion.  Advised patient may use Baclofen daily or as needed for accompanying muscle spasms. Encouraged patient to avoid offending activities such as pulling, pushing, or lifting with the left shoulder for the next 7 to 10 days.  Encouraged patient to increase daily water intake while taking these medications.

## 2022-01-21 NOTE — ED Triage Notes (Signed)
Pt works in a [employer] and does a lot of heavy lifting and states his left shoulder and left knee have been hurting for weeks. Denies known injury but states the pain is getting worse.

## 2022-01-21 NOTE — ED Provider Notes (Signed)
[facility]    CSN: [id number] Arrival date & time: 01/21/22  1447      History   Chief Complaint Chief Complaint  Patient presents with   Arm Injury    HPI Micheal Taylor is a 21 y.o. male.   HPI 21 year old male presents with left shoulder pain and left knee pain for 2 weeks.  Patient reports heavy lifting involved with his job.  Patient denies injury or insult to left shoulder or left knee.  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 Authorizing Provider  baclofen (LIORESAL) 10 MG tablet Take 1 tablet (10 mg total) by mouth 3 (three) times daily. 01/21/22  Yes Eliezer Lofts, FNP  predniSONE (DELTASONE) 20 MG tablet Take 3 tabs PO daily x 3 days, then 2 tabs PO daily x 3 days, then 1 tab PO daily x 3 days 01/21/22  Yes Eliezer Lofts, FNP  albuterol (VENTOLIN HFA) 108 (90 Base) MCG/ACT inhaler Inhale 1-2 puffs into the lungs every 6 (six) hours as needed for wheezing or shortness of breath. 10/13/21   Magda Bernheim, NP  cetirizine (ZYRTEC ALLERGY) 10 MG tablet Take 1 tablet (10 mg total) by mouth at bedtime. 10/20/20 10/20/21  Garnet Sierras, NP  fluticasone (FLONASE) 50 MCG/ACT nasal spray Place 1-2 sprays into both nostrils 2 (two) times daily as needed for allergies or rhinitis. 03/23/21   Liane Comber, NP  ibuprofen (ADVIL) 800 MG tablet Take 1 tablet (800 mg total) by mouth every 8 (eight) hours  as needed for moderate pain. 10/20/20   McClanahan, Danton Sewer, NP  montelukast (SINGULAIR) 10 MG tablet Take 10 mg by mouth at bedtime.    [provider]  promethazine-dextromethorphan (PROMETHAZINE-DM) 6.25-15 MG/5ML syrup Take 5 mLs by mouth 4 (four) times daily as needed for cough. Patient not taking: Reported on 10/13/2021 08/27/21   Liane Comber, NP  triamcinolone ointment (KENALOG) 0.1 % Apply 1 application topically 2 (two) times daily. Patient taking differently: Apply 1 application topically 2 (two) times daily. Using PRN 02/09/21   Garnet Sierras, NP    Family History Family History  Problem Relation Age of Onset   Hypertension Mother    Hypertension Maternal Grandmother    Dementia Maternal Grandfather    Alzheimer's disease Paternal Grandfather     Social History Social History   Tobacco Use   Smoking status: Never   Smokeless tobacco: Never  Vaping Use   Vaping Use: Never used  Substance Use Topics   Alcohol use: No    Alcohol/week: 0.0 standard drinks   Drug use: No     Allergies   Patient has no known allergies.   Review of Systems Review of Systems  Musculoskeletal:        Left knee pain and left shoulder pain x 2 weeks  All other systems reviewed and are negative.   Physical Exam Triage Vital  Signs ED Triage Vitals  Enc Vitals Group     BP 01/21/22 1506 135/66     Pulse Rate 01/21/22 1506 68     Resp 01/21/22 1506 16     Temp 01/21/22 1506 98.4 F (36.9 C)     Temp Source 01/21/22 1506 Oral     SpO2 01/21/22 1506 100 %     Weight --      Height --      Head Circumference --      Peak Flow --      Pain Score 01/21/22 1507 7     Pain Loc --      Pain Edu? --      Excl. in [place]? --    No data found.  Updated Vital Signs BP 135/66 (BP Location: Right Arm)    Pulse 68    Temp 98.4 F (36.9 C) (Oral)    Resp 16    SpO2 100%      Physical Exam Vitals and nursing note reviewed.  Constitutional:      General: He is not in acute  distress.    Appearance: Normal appearance. He is normal weight. He is not ill-appearing.  HENT:     Head: Normocephalic and atraumatic.     Mouth/Throat:     Mouth: Mucous membranes are moist.     Pharynx: Oropharynx is clear.  Eyes:     Extraocular Movements: Extraocular movements intact.     Conjunctiva/sclera: Conjunctivae normal.     Pupils: Pupils are equal, round, and reactive to light.  Cardiovascular:     Rate and Rhythm: Normal rate and regular rhythm.     Pulses: Normal pulses.     Heart sounds: Normal heart sounds.  Pulmonary:     Effort: Pulmonary effort is normal.     Breath sounds: Normal breath sounds.  Musculoskeletal:     Cervical back: Normal range of motion and neck supple. No tenderness.     Comments: Left shoulder (anterior aspects): TTP over [place] and AC joints, limited range of motion with forward flexion and horizontal abduction, no deformity noted.    Lymphadenopathy:     Cervical: No cervical adenopathy.  Skin:    General: Skin is warm and dry.  Neurological:     General: No focal deficit present.     Mental Status: He is alert and oriented to person, place, and time.     UC Treatments / Results  Labs (all labs ordered are listed, but only abnormal results are displayed) Labs Reviewed - No data to display  EKG   Radiology DG Shoulder Left  Result Date: 01/21/2022 CLINICAL DATA:  Left shoulder pain EXAM: LEFT SHOULDER - 2+ VIEW COMPARISON:  None. FINDINGS: There is no evidence of fracture or dislocation. There is no evidence of arthropathy or other focal bone abnormality. Soft tissues are unremarkable. IMPRESSION: Negative. Electronically Signed   By: Keane Police D.O.   On: 01/21/2022 16:02    Procedures Procedures (including critical care time)  Medications Ordered in UC Medications - No data to display  Initial Impression / Assessment and Plan / UC Course  I have reviewed the triage vital signs and the nursing notes.  Pertinent labs &  imaging results that were available during my care of the patient were reviewed by me and considered in my medical decision making (see chart for details).     MDM: 1.  Left anterior shoulder pain-left shoulder x-ray was negative for acute osseous  process Rx'd Prednisone taper and Baclofen. Advised patient to take medication as directed with food to completion.  Advised patient may use Baclofen daily or as needed for accompanying muscle spasms.  Encouraged patient to avoid offending activities such as pulling, pushing, or lifting with the left shoulder for the next 7 to 10 days. Encouraged patient to increase daily water intake while taking these medications.  Work note provided prior to discharge today.  Patient discharged home, hemodynamically stable. Final Clinical Impressions(s) / UC Diagnoses   Final diagnoses:  Left anterior shoulder pain     Discharge Instructions      Advised patient to take medication as directed with food to completion.  Advised patient may use Baclofen daily or as needed for accompanying muscle spasms. Encouraged patient to avoid offending activities such as pulling, pushing, or lifting with the left shoulder for the next 7 to 10 days.  Encouraged patient to increase daily water intake while taking these medications.     ED Prescriptions     Medication Sig Dispense Auth. Provider   predniSONE (DELTASONE) 20 MG tablet Take 3 tabs PO daily x 3 days, then 2 tabs PO daily x 3 days, then 1 tab PO daily x 3 days 18 tablet Eliezer Lofts, FNP   baclofen (LIORESAL) 10 MG tablet Take 1 tablet (10 mg total) by mouth 3 (three) times daily. 64 each Eliezer Lofts, FNP      PDMP not reviewed this encounter.   Eliezer Lofts, [place] 01/21/22 1623

## 2022-02-17 ENCOUNTER — Other Ambulatory Visit: Payer: Self-pay | Admitting: Adult Health

## 2022-02-17 ENCOUNTER — Encounter: Payer: Self-pay | Admitting: Adult Health

## 2022-02-17 MED ORDER — MONTELUKAST SODIUM 10 MG PO TABS: 10.0000 mg | ORAL_TABLET | Freq: Every day | ORAL | 1 refills | Status: DC

## 2022-03-04 ENCOUNTER — Emergency Department
Admission: RE | Admit: 2022-03-04 | Discharge: 2022-03-04 | Disposition: A | Discharge status: Home / Self Care | Payer: 59 | Source: Ambulatory Visit

## 2022-03-04 ENCOUNTER — Other Ambulatory Visit: Payer: Self-pay

## 2022-03-04 VITALS — BP 119/76 | HR 78 | Temp 99.1°F | Resp 15 | Ht 65.5 in | Wt 192.0 lb

## 2022-03-04 DIAGNOSIS — R0989 Other specified symptoms and signs involving the circulatory and respiratory systems: Secondary | ICD-10-CM

## 2022-03-04 DIAGNOSIS — R059 Cough, unspecified: Secondary | ICD-10-CM

## 2022-03-04 MED ORDER — BENZONATATE 200 MG PO CAPS: 200.0000 mg | ORAL_CAPSULE | Freq: Three times a day (TID) | ORAL | 0 refills | Status: AC | PRN

## 2022-03-04 MED ORDER — AZITHROMYCIN 250 MG PO TABS: 250.0000 mg | ORAL_TABLET | Freq: Every day | ORAL | 0 refills | Status: DC

## 2022-03-04 NOTE — ED Provider Notes (Signed)
?[facility] ? ? ? ?CSN: [id number] ?Arrival date & time: 03/04/22  1057 ? ? ?  ? ?History   ?Chief Complaint ?Chief Complaint  ?Patient presents with  ? Cough  ?  Congestion,trouble breathing - Entered by patient  ? ? ?HPI ?Micheal Taylor is a 21 y.o. male.  ? ?HPI 21 year old male presents with sinus and chest congestion for 2-3 days.  Cough started last night.  Reports using OTC Mucinex. Patient requests work excuse note. ? ?Past Medical History:  ?Diagnosis Date  ? Asthma   ? ? ?Patient Active Problem List  ? Diagnosis Date Noted  ? Acne 05/09/2018  ? Environmental allergies 12/02/2014  ? Eczema 09/30/2014  ? 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 ? ?Past Surgical History:  ?Procedure Laterality Date  ? NO PAST SURGERIES    ? TONSILLECTOMY AND ADENOIDECTOMY Bilateral 06/03/2019  ? Procedure: TONSILLECTOMY AND ADENOIDECTOMY;  Surgeon: Drema Halon, MD;  Location: [facility];  Service: ENT;  Laterality: Bilateral;  ? ? ? ? ? ?Home Medications   ? ?Prior to Admission medications   ?Medication Sig Start Date End Date Taking? Authorizing Provider  ?azithromycin (ZITHROMAX) 250 MG tablet Take 1 tablet (250 mg total) by mouth daily. Take first 2 tablets together, then 1 every day until finished. 03/04/22  Yes Trevor Iha, FNP  ?benzonatate (TESSALON) 200 MG capsule Take 1 capsule (200 mg total) by mouth 3 (three) times daily as needed for up to 7 days for cough. 03/04/22 03/11/22 Yes Trevor Iha, FNP  ?albuterol (VENTOLIN HFA) 108 (90 Base) MCG/ACT inhaler Inhale 1-2 puffs into the lungs every 6 (six) hours as needed for wheezing or shortness of breath. 10/13/21   Revonda Humphrey, NP  ?baclofen (LIORESAL) 10 MG tablet Take 1 tablet (10 mg total) by mouth 3 (three) times daily. ?Patient not taking: Reported on 03/04/2022 01/21/22   Trevor Iha, FNP  ?cetirizine (ZYRTEC ALLERGY) 10 MG tablet Take 1 tablet (10 mg total) by mouth at bedtime. ?Patient not taking: Reported on 03/04/2022 10/20/20  10/20/21  Elder Negus, NP  ?fluticasone (FLONASE) 50 MCG/ACT nasal spray Place 1-2 sprays into both nostrils 2 (two) times daily as needed for allergies or rhinitis. 03/23/21   Judd Gaudier, NP  ?ibuprofen (ADVIL) 800 MG tablet Take 1 tablet (800 mg total) by mouth every 8 (eight) hours as needed for moderate pain. ?Patient not taking: Reported on 03/04/2022 10/20/20   Elder Negus, NP  ?montelukast (SINGULAIR) 10 MG tablet Take 1 tablet (10 mg total) by mouth at bedtime. For allergies. 02/17/22   Judd Gaudier, NP  ?predniSONE (DELTASONE) 20 MG tablet Take 3 tabs PO daily x 3 days, then 2 tabs PO daily x 3 days, then 1 tab PO daily x 3 days ?Patient not taking: Reported on 03/04/2022 01/21/22   Trevor Iha, FNP  ?promethazine-dextromethorphan (PROMETHAZINE-DM) 6.25-15 MG/5ML syrup Take 5 mLs by mouth 4 (four) times daily as needed for cough. ?Patient not taking: Reported on 10/13/2021 08/27/21   Judd Gaudier, NP  ?triamcinolone ointment (KENALOG) 0.1 % Apply 1 application topically 2 (two) times daily. ?Patient taking differently: Apply 1 application topically 2 (two) times daily. Using PRN 02/09/21   Elder Negus, NP  ? ? ?Family History ?Family History  ?Problem Relation Age of Onset  ? Hypertension Mother   ? Hypertension Maternal Grandmother   ? Dementia Maternal Grandfather   ? Alzheimer's disease Paternal Grandfather   ? ? ?Social History ?Social History  ? ?Tobacco Use  ?  Smoking status: Never  ? Smokeless tobacco: Never  ?Vaping Use  ? Vaping Use: Never used  ?Substance Use Topics  ? Alcohol use: No  ?  Alcohol/week: 0.0 standard drinks  ? Drug use: No  ? ? ? ?Allergies   ?Patient has no known allergies. ? ? ?Review of Systems ?Review of Systems  ?HENT:  Positive for congestion.   ?Respiratory:  Positive for cough.   ?     Chest congestion x 2-3 days  ?All other systems reviewed and are negative. ? ? ?Physical Exam ?Triage Vital Signs ?ED Triage Vitals  ?Enc Vitals Group  ?   BP 03/04/22 1119  119/76  ?   Pulse Rate 03/04/22 1119 78  ?   Resp 03/04/22 1119 15  ?   Temp 03/04/22 1119 99.1 ?F (37.3 ?C)  ?   Temp Source 03/04/22 1119 Oral  ?   SpO2 03/04/22 1119 97 %  ?   Weight 03/04/22 1121 192 lb (87.1 kg)  ?   Height 03/04/22 1121 5' 5.5" (1.664 m)  ?   Head Circumference --   ?   Peak Flow --   ?   Pain Score 03/04/22 1121 5  ?   Pain Loc --   ?   Pain Edu? --   ?   Excl. in [place]? --   ? ?No data found. ? ?Updated Vital Signs ?BP 119/76 (BP Location: Left Arm)   Pulse 78   Temp 99.1 ?F (37.3 ?C) (Oral)   Resp 15   Ht 5' 5.5" (1.664 m)   Wt 192 lb (87.1 kg)   SpO2 97%   BMI 31.46 kg/m?  ? ?   ? ?Physical Exam ?Vitals and nursing note reviewed.  ?Constitutional:   ?   Appearance: Normal appearance. He is normal weight.  ?HENT:  ?   Head: Normocephalic and atraumatic.  ?   Right Ear: Tympanic membrane, ear canal and external ear normal.  ?   Left Ear: Tympanic membrane, ear canal and external ear normal.  ?   Mouth/Throat:  ?   Mouth: Mucous membranes are moist.  ?   Pharynx: Oropharynx is clear.  ?Eyes:  ?   Extraocular Movements: Extraocular movements intact.  ?   Conjunctiva/sclera: Conjunctivae normal.  ?   Pupils: Pupils are equal, round, and reactive to light.  ?Cardiovascular:  ?   Rate and Rhythm: Normal rate and regular rhythm.  ?   Pulses: Normal pulses.  ?   Heart sounds: Normal heart sounds. No murmur heard. ?Pulmonary:  ?   Effort: Pulmonary effort is normal.  ?   Breath sounds: Normal breath sounds. No wheezing, rhonchi or rales.  ?   Comments: Infrequent nonproductive cough noted on exam ?Musculoskeletal:     ?   General: Normal range of motion.  ?   Cervical back: Normal range of motion and neck supple. No tenderness.  ?Lymphadenopathy:  ?   Cervical: No cervical adenopathy.  ?Skin: ?   General: Skin is warm and dry.  ?Neurological:  ?   General: No focal deficit present.  ?   Mental Status: He is alert and oriented to person, place, and time.  ? ? ? ?UC Treatments / Results   ?Labs ?(all labs ordered are listed, but only abnormal results are displayed) ?Labs Reviewed - No data to display ? ?EKG ? ? ?Radiology ?No results found. ? ?Procedures ?Procedures (including critical care time) ? ?Medications Ordered in UC ?Medications - No  data to display ? ?Initial Impression / Assessment and Plan / UC Course  ?I have reviewed the triage vital signs and the nursing notes. ? ?Pertinent labs & imaging results that were available during my care of the patient were reviewed by me and considered in my medical decision making (see chart for details). ? ?  ? ?MDM: 1.  Chest congestion-Rx'd Zithromax; 2.  Cough-Rx'd [employer]. Advised patient to take medication as directed with food to completion.  Advised patient may take Tessalon Perles daily or as needed for cough.  Encouraged patient to increase daily water intake while taking these medications.  Work note provided to patient per his request prior to discharge.  Patient discharged home hemodynamically stable. ?Final Clinical Impressions(s) / UC Diagnoses  ? ?Final diagnoses:  ?Chest congestion  ?Cough, unspecified type  ? ? ? ?Discharge Instructions   ? ?  ?Advised patient to take medication as directed with food to completion.  Advised patient may take Tessalon Perles daily or as needed for cough.  Encouraged patient to increase daily water intake while taking these medications. ? ? ? ? ?ED Prescriptions   ? ? Medication Sig Dispense Auth. Provider  ? azithromycin (ZITHROMAX) 250 MG tablet Take 1 tablet (250 mg total) by mouth daily. Take first 2 tablets together, then 1 every day until finished. 6 tablet Trevor Iha, FNP  ? benzonatate (TESSALON) 200 MG capsule Take 1 capsule (200 mg total) by mouth 3 (three) times daily as needed for up to 7 days for cough. 40 capsule Trevor Iha, FNP  ? ?  ? ?PDMP not reviewed this encounter. ?  ?Trevor Iha, FNP ?03/04/22 1246 ? ?

## 2022-03-04 NOTE — ED Triage Notes (Signed)
Sinus congestion since wed  ?Cough started last night  ?Denies fever ?OTC Mucinex - singulair  & flonase  ?

## 2022-03-04 NOTE — Discharge Instructions (Addendum)
Advised patient to take medication as directed with food to completion.  Advised patient may take Tessalon Perles daily or as needed for cough.  Encouraged patient to increase daily water intake while taking these medications. 

## 2022-03-10 ENCOUNTER — Encounter: Payer: Self-pay | Admitting: Adult Health

## 2022-06-13 ENCOUNTER — Encounter: Payer: Self-pay | Admitting: Adult Health

## 2022-06-17 NOTE — Progress Notes (Unsigned)
Assessment and Plan:  There are no diagnoses linked to this encounter.    Further disposition pending results of labs. Discussed med's effects and SE's.   Over 30 minutes of exam, counseling, chart review, and critical decision making was performed.   Future Appointments  Date Time Provider Department Center  06/20/2022 11:30 AM [name], NP [facility] None  07/21/2022  2:00 PM Lucky Cowboy, MD [facility] None    ------------------------------------------------------------------------------------------------------------------   HPI There were no vitals taken for this visit. 21 y.o.male presents for  Past Medical History:  Diagnosis Date   Asthma      No Known Allergies  Current Outpatient Medications on File Prior to Visit  Medication Sig   albuterol (VENTOLIN HFA) 108 (90 Base) MCG/ACT inhaler Inhale 1-2 puffs into the lungs every 6 (six) hours as needed for wheezing or shortness of breath.   azithromycin (ZITHROMAX) 250 MG tablet Take 1 tablet (250 mg total) by mouth daily. Take first 2 tablets together, then 1 every day until finished.   baclofen (LIORESAL) 10 MG tablet Take 1 tablet (10 mg total) by mouth 3 (three) times daily. (Patient not taking: Reported on 03/04/2022)   cetirizine (ZYRTEC ALLERGY) 10 MG tablet Take 1 tablet (10 mg total) by mouth at bedtime. (Patient not taking: Reported on 03/04/2022)   fluticasone (FLONASE) 50 MCG/ACT nasal spray Place 1-2 sprays into both nostrils 2 (two) times daily as needed for allergies or rhinitis.   ibuprofen (ADVIL) 800 MG tablet Take 1 tablet (800 mg total) by mouth every 8 (eight) hours as needed for moderate pain. (Patient not taking: Reported on 03/04/2022)   montelukast (SINGULAIR) 10 MG tablet Take 1 tablet (10 mg total) by mouth at bedtime. For allergies.   predniSONE (DELTASONE) 20 MG tablet Take 3 tabs PO daily x 3 days, then 2 tabs PO daily x 3 days, then 1 tab PO daily x 3 days (Patient not taking: Reported  on 03/04/2022)   promethazine-dextromethorphan (PROMETHAZINE-DM) 6.25-15 MG/5ML syrup Take 5 mLs by mouth 4 (four) times daily as needed for cough. (Patient not taking: Reported on 10/13/2021)   triamcinolone ointment (KENALOG) 0.1 % Apply 1 application topically 2 (two) times daily. (Patient taking differently: Apply 1 application topically 2 (two) times daily. Using PRN)   No current facility-administered medications on file prior to visit.    ROS: all negative except above.   Physical Exam:  There were no vitals taken for this visit.  General Appearance: Well nourished, in no apparent distress. Eyes: PERRLA, EOMs, conjunctiva no swelling or erythema Sinuses: No Frontal/maxillary tenderness ENT/Mouth: Ext aud canals clear, TMs without erythema, bulging. No erythema, swelling, or exudate on post pharynx.  Tonsils not swollen or erythematous. Hearing normal.  Neck: Supple, thyroid normal.  Respiratory: Respiratory effort normal, BS equal bilaterally without rales, rhonchi, wheezing or stridor.  Cardio: RRR with no MRGs. Brisk peripheral pulses without edema.  Abdomen: Soft, + BS.  Non tender, no guarding, rebound, hernias, masses. Lymphatics: Non tender without lymphadenopathy.  Musculoskeletal: Full ROM, 5/5 strength, normal gait.  Skin: Warm, dry without rashes, lesions, ecchymosis.  Neuro: Cranial nerves intact. Normal muscle tone, no cerebellar symptoms. Sensation intact.  Psych: Awake and oriented X 3, normal affect, Insight and Judgment appropriate.     [name], NP 11:12 AM [facility]

## 2022-06-20 ENCOUNTER — Ambulatory Visit (INDEPENDENT_AMBULATORY_CARE_PROVIDER_SITE_OTHER): Payer: 59 | Admitting: Nurse Practitioner

## 2022-06-20 ENCOUNTER — Encounter: Payer: Self-pay | Admitting: Nurse Practitioner

## 2022-06-20 VITALS — BP 102/62 | HR 66 | Temp 97.5°F | Wt 180.6 lb

## 2022-06-20 DIAGNOSIS — Z113 Encounter for screening for infections with a predominantly sexual mode of transmission: Secondary | ICD-10-CM | POA: Diagnosis not present

## 2022-06-20 DIAGNOSIS — Z79899 Other long term (current) drug therapy: Secondary | ICD-10-CM | POA: Diagnosis not present

## 2022-06-20 DIAGNOSIS — R634 Abnormal weight loss: Secondary | ICD-10-CM

## 2022-06-21 ENCOUNTER — Other Ambulatory Visit: Payer: Self-pay | Admitting: Nurse Practitioner

## 2022-06-21 DIAGNOSIS — R7989 Other specified abnormal findings of blood chemistry: Secondary | ICD-10-CM

## 2022-06-21 LAB — COMPLETE METABOLIC PANEL WITH GFR
AG Ratio: 1.9 (calc) (ref 1.0–2.5)
ALT: 16 U/L (ref 9–46)
AST: 17 U/L (ref 10–40)
Albumin: 4.3 g/dL (ref 3.6–5.1)
Alkaline phosphatase (APISO): 49 U/L (ref 36–130)
BUN: 23 mg/dL (ref 7–25)
CO2: 27 mmol/L (ref 20–32)
Calcium: 9.3 mg/dL (ref 8.6–10.3)
Chloride: 107 mmol/L (ref 98–110)
Creat: 1.19 mg/dL (ref 0.60–1.24)
Globulin: 2.3 g/dL (calc) (ref 1.9–3.7)
Glucose, Bld: 87 mg/dL (ref 65–99)
Potassium: 4.6 mmol/L (ref 3.5–5.3)
Sodium: 141 mmol/L (ref 135–146)
Total Bilirubin: 1.1 mg/dL (ref 0.2–1.2)
Total Protein: 6.6 g/dL (ref 6.1–8.1)
eGFR: 90 mL/min/{1.73_m2} (ref >=60)

## 2022-06-21 LAB — CBC WITH DIFFERENTIAL/PLATELET
Absolute Monocytes: 513 cells/uL (ref 200–950)
Basophils Absolute: 22 cells/uL (ref 0–200)
Basophils Relative: 0.4 %
Eosinophils Absolute: 221 cells/uL (ref 15–500)
Eosinophils Relative: 4.1 %
HCT: 44.2 % (ref 38.5–50.0)
Hemoglobin: 15.1 g/dL (ref 13.2–17.1)
Lymphs Abs: 1879 cells/uL (ref 850–3900)
MCH: 30.3 pg (ref 27.0–33.0)
MCHC: 34.2 g/dL (ref 32.0–36.0)
MCV: 88.8 fL (ref 80.0–100.0)
MPV: 10.2 fL (ref 7.5–12.5)
Monocytes Relative: 9.5 %
Neutro Abs: 2765 cells/uL (ref 1500–7800)
Neutrophils Relative %: 51.2 %
Platelets: 286 10*3/uL (ref 140–400)
RBC: 4.98 10*6/uL (ref 4.20–5.80)
RDW: 12.4 % (ref 11.0–15.0)
Total Lymphocyte: 34.8 %
WBC: 5.4 10*3/uL (ref 3.8–10.8)

## 2022-06-21 LAB — HIV ANTIBODY (ROUTINE TESTING W REFLEX): HIV 1&2 Ab, 4th Generation: NONREACTIVE

## 2022-06-21 LAB — TSH: TSH: 0.39 mIU/L — ABNORMAL LOW (ref 0.40–4.50)

## 2022-06-21 LAB — RPR: RPR Ser Ql: NONREACTIVE

## 2022-06-25 ENCOUNTER — Encounter: Payer: Self-pay | Admitting: Nurse Practitioner

## 2022-07-07 ENCOUNTER — Encounter: Payer: Self-pay | Admitting: Nurse Practitioner

## 2022-07-07 ENCOUNTER — Ambulatory Visit (INDEPENDENT_AMBULATORY_CARE_PROVIDER_SITE_OTHER): Payer: 59 | Admitting: Nurse Practitioner

## 2022-07-07 VITALS — BP 112/64 | HR 69 | Temp 97.5°F | Ht 65.5 in | Wt 175.0 lb

## 2022-07-07 DIAGNOSIS — Q386 Other congenital malformations of mouth: Secondary | ICD-10-CM

## 2022-07-07 DIAGNOSIS — Z113 Encounter for screening for infections with a predominantly sexual mode of transmission: Secondary | ICD-10-CM | POA: Diagnosis not present

## 2022-07-07 NOTE — Progress Notes (Signed)
Assessment and Plan:  Micheal Taylor was seen today for acute home visit.  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 trachomatis/N. gonorrhoeae RNA -     Culture, routine-genital        Further disposition pending results of labs. Discussed med's effects and SE's.   Over 30 minutes of exam, counseling, chart review, and critical decision making was performed.   Future Appointments  Date Time Provider Department Center  07/27/2022  3:00 PM [name], NP [facility] None    ------------------------------------------------------------------------------------------------------------------   HPI BP 112/64   Pulse 69   Temp (!) 97.5 F (36.4 C)   Ht 5' 5.5" (1.664 m)   Wt 175 lb (79.4 kg)   SpO2 98%   BMI 28.68 kg/m   21 y.o.male presents for painful area at end of penis that did drain white discharge but remain swollen .  Has been present for about 1 week  BMI is Body mass index is 28.68 kg/m., he has not been working on diet and exercise. Has appointment for physical in 2 weeks and will recheck TSH- hyperthyroid on last check.  Works in a hot physical job [employer] Readings from Last 3 Encounters:  07/07/22 175 lb (79.4 kg)  06/20/22 180 lb 9.6 oz (81.9 kg)  03/04/22 192 lb (87.1 kg)     Past Medical History:  Diagnosis Date   Asthma      No Known Allergies  Current Outpatient Medications on File Prior to Visit  Medication Sig   albuterol (VENTOLIN HFA) 108 (90 Base) MCG/ACT inhaler Inhale 1-2 puffs into the lungs every 6 (six) hours as needed for wheezing or shortness of breath.   fluticasone (FLONASE) 50 MCG/ACT nasal spray Place 1-2 sprays into both nostrils 2 (two) times daily as needed for allergies or rhinitis.   ibuprofen (ADVIL) 800 MG tablet Take 1 tablet (800 mg total) by mouth every 8 (eight) hours as  needed for moderate pain.   montelukast (SINGULAIR) 10 MG tablet Take 1 tablet (10 mg total) by mouth at bedtime. For allergies.   triamcinolone ointment (KENALOG) 0.1 % Apply 1 application topically 2 (two) times daily. (Patient taking differently: Apply 1 application  topically 2 (two) times daily. Using PRN)   cetirizine (ZYRTEC ALLERGY) 10 MG tablet Take 1 tablet (10 mg total) by mouth at bedtime.   No current facility-administered medications on file prior to visit.    ROS: all negative except above.   Physical Exam:  BP 112/64   Pulse 69   Temp (!) 97.5 F (36.4 C)   Ht 5' 5.5" (1.664 m)   Wt 175 lb (79.4 kg)   SpO2 98%   BMI 28.68 kg/m   General Appearance: Well nourished, in no apparent distress. Eyes: PERRLA, EOMs, conjunctiva no swelling or erythema Sinuses: No Frontal/maxillary tenderness ENT/Mouth: Ext aud canals clear, TMs without erythema, bulging. No erythema, swelling, or exudate on post pharynx.  Tonsils not swollen or erythematous. Hearing normal.  Neck: Supple, thyroid normal.  Respiratory: Respiratory effort normal, BS equal bilaterally without rales, rhonchi, wheezing or stridor.  Cardio: RRR with no MRGs. Brisk peripheral pulses without edema.  Abdomen: Soft, + BS.  Non tender, no guarding, rebound, hernias, masses. Lymphatics: Non tender without lymphadenopathy.  Musculoskeletal: Full ROM, 5/5 strength, normal gait.  Skin: Warm, dry without rashes,  lesions, ecchymosis.  Neuro: Cranial nerves intact. Normal muscle tone, no cerebellar symptoms. Sensation intact.  Psych: Awake and oriented X 3, normal affect, Insight and Judgment appropriate.  Male genitalia: no testicular masses  Penis: circumcised and lesions: Has several sebaceous appearing areas on right side of shaft. On right side of head of penis is a small crusted lesion which drains white mucus- area is cultured, slightly tender to palpation    [name], NP 9:16 AM [facility]

## 2022-07-07 NOTE — Patient Instructions (Signed)
Fordyce spots (also termed Fordyce granules) are harmless and painless visible sebaceous glands typically appearing as white/yellow small bumps or spots on the inside of lips or cheeks, gums, or genitalia.[1][2] They are common,[3] and are present in around 80% of adults.[1] Treatment is generally not required and attempts to remove them typically result in pain and scarring.[3]  Their cause is unclear and[3] they are not associated with hair follicles.[3] Diagnosis is by visualisation.[3] They may appear similar to genital warts or molluscum.[3] They were first described in 1896 by American dermatologist, Despina Hidden.[4]

## 2022-07-11 LAB — C. TRACHOMATIS/N. GONORRHOEAE RNA
C. trachomatis RNA, TMA: NOT DETECTED
N. gonorrhoeae RNA, TMA: NOT DETECTED

## 2022-07-11 LAB — CULTURE, ROUTINE-GENITAL
MICRO NUMBER:: 13707461
RESULT:: NORMAL
SPECIMEN QUALITY:: ADEQUATE

## 2022-07-16 ENCOUNTER — Ambulatory Visit (INDEPENDENT_AMBULATORY_CARE_PROVIDER_SITE_OTHER): Payer: 59

## 2022-07-16 ENCOUNTER — Ambulatory Visit
Admission: RE | Admit: 2022-07-16 | Discharge: 2022-07-16 | Disposition: A | Discharge status: Home / Self Care | Payer: 59 | Source: Ambulatory Visit | Attending: Family Medicine | Admitting: Family Medicine

## 2022-07-16 VITALS — BP 125/77 | HR 105 | Temp 100.3°F | Resp 14 | Ht 65.5 in | Wt 175.0 lb

## 2022-07-16 DIAGNOSIS — U071 COVID-19: Secondary | ICD-10-CM

## 2022-07-16 DIAGNOSIS — T148XXA Other injury of unspecified body region, initial encounter: Secondary | ICD-10-CM | POA: Diagnosis not present

## 2022-07-16 DIAGNOSIS — M79672 Pain in left foot: Secondary | ICD-10-CM | POA: Diagnosis not present

## 2022-07-16 DIAGNOSIS — S99922A Unspecified injury of left foot, initial encounter: Secondary | ICD-10-CM

## 2022-07-16 DIAGNOSIS — R2242 Localized swelling, mass and lump, left lower limb: Secondary | ICD-10-CM | POA: Diagnosis not present

## 2022-07-16 DIAGNOSIS — J029 Acute pharyngitis, unspecified: Secondary | ICD-10-CM

## 2022-07-16 LAB — POC SARS CORONAVIRUS 2 AG -  ED: SARS Coronavirus 2 Ag: POSITIVE — AB

## 2022-07-16 MED ORDER — ACETAMINOPHEN 325 MG PO TABS
650.0000 mg | ORAL_TABLET | Freq: Once | ORAL | Status: AC
  Administered 2022-07-16: 650 mg via ORAL

## 2022-07-16 MED ORDER — IBUPROFEN 800 MG PO TABS: 800.0000 mg | ORAL_TABLET | Freq: Three times a day (TID) | ORAL | 0 refills | Status: AC | PRN

## 2022-07-16 NOTE — ED Triage Notes (Addendum)
Pt  dropped something on the top of his left foot 2 months ago  No swelling at that time Lump came up last night  w/ pain - worse when touched or standing Ice & heat helped last night  Tylenol last night  Cold symptoms since yesterday morning Here w/ girlfriend

## 2022-07-16 NOTE — ED Provider Notes (Signed)
[facility]    CSN: [id number] Arrival date & time: 07/16/22  1203      History   Chief Complaint Chief Complaint  Patient presents with   Foot Injury    left    HPI Micheal Taylor is a 21 y.o. male.   HPI  Micheal Taylor is here for foot problem.  He states he dropped something heavy on it 2 month ago but this seemed to get better.  Since yesterday it has become slightly more swollen and very painful.  No new trauma.  No other activity that might of injured his foot. In addition he states he has a "cold".  Runny nose and mild sore throat.  Temperature on arrival is 102.4.  No coughing or chest congestion.  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 Authorizing Provider  cetirizine (ZYRTEC ALLERGY) 10 MG tablet Take 1 tablet (10 mg total) by mouth at bedtime. 10/20/20 07/16/22 Yes McClanahan, Bella Kennedy, NP  albuterol (VENTOLIN HFA) 108 (90 Base) MCG/ACT inhaler Inhale 1-2 puffs into the lungs every 6 (six) hours as needed for wheezing or shortness of breath. 10/13/21   [name], NP  fluticasone (FLONASE) 50 MCG/ACT nasal spray Place 1-2 sprays into both nostrils 2 (two) times daily as needed for allergies or rhinitis. 03/23/21   Judd Gaudier, NP  ibuprofen (ADVIL) 800 MG tablet Take 1 tablet (800 mg total) by mouth every 8 (eight) hours as needed for moderate pain. 07/16/22   Eustace Moore, MD   montelukast (SINGULAIR) 10 MG tablet Take 1 tablet (10 mg total) by mouth at bedtime. For allergies. Patient not taking: Reported on 07/16/2022 02/17/22   Judd Gaudier, NP  triamcinolone ointment (KENALOG) 0.1 % Apply 1 application topically 2 (two) times daily. Patient taking differently: Apply 1 application  topically 2 (two) times daily. Using PRN 02/09/21   Elder Negus, NP    Family History Family History  Problem Relation Age of Onset   Hypertension Mother    Hypertension Maternal Grandmother    Dementia Maternal Grandfather    Alzheimer's disease Paternal Grandfather     Social History Social History   Tobacco Use   Smoking status: Never   Smokeless tobacco: Never  Vaping Use   Vaping Use: Never used  Substance Use Topics   Alcohol use: No    Alcohol/week: 0.0 standard drinks of alcohol   Drug use: No     Allergies   Patient has no known allergies.   Review of Systems Review of Systems See HPI  Physical Exam Triage Vital Signs ED Triage Vitals  Enc Vitals Group     BP 07/16/22 1221 125/77     Pulse Rate 07/16/22 1221 (!) 105     Resp 07/16/22 1221 14     Temp 07/16/22 1221 (!) 102.4 F (39.1 C)     Temp Source 07/16/22 1221  Oral     SpO2 07/16/22 1221 96 %     Weight 07/16/22 1223 175 lb (79.4 kg)     Height 07/16/22 1223 5' 5.5" (1.664 m)     Head Circumference --      Peak Flow --      Pain Score 07/16/22 1222 8     Pain Loc --      Pain Edu? --      Excl. in GC? --    No data found.  Updated Vital Signs BP 125/77 (BP Location: Left Arm)   Pulse (!) 105   Temp 100.3 F (37.9 C) (Oral)   Resp 14   Ht 5' 5.5" (1.664 m)   Wt 79.4 kg   SpO2 96%   BMI 28.68 kg/m       Physical Exam Constitutional:      General: He is not in acute distress.    Appearance: He is well-developed. He is ill-appearing.  HENT:     Head: Normocephalic and atraumatic.  Eyes:     Conjunctiva/sclera: Conjunctivae normal.     Pupils: Pupils are equal, round,  and reactive to light.  Cardiovascular:     Rate and Rhythm: Normal rate.  Pulmonary:     Effort: Pulmonary effort is normal. No respiratory distress.  Abdominal:     General: There is no distension.     Palpations: Abdomen is soft.  Musculoskeletal:        General: Normal range of motion.     Cervical back: Normal range of motion.       Feet:  Skin:    General: Skin is warm and dry.  Neurological:     Mental Status: He is alert.      UC Treatments / Results  Labs (all labs ordered are listed, but only abnormal results are displayed) Labs Reviewed  POC SARS CORONAVIRUS 2 AG -  ED - Abnormal; Notable for the following components:      Result Value   SARS Coronavirus 2 Ag Positive (*)    All other components within normal limits    EKG   Radiology DG Foot Complete Left  Result Date: 07/16/2022 CLINICAL DATA:  Patient dropped an object on his foot 2 months ago. First metatarsal region pain and lump. EXAM: LEFT FOOT - COMPLETE 3+ VIEW COMPARISON:  None Available. FINDINGS: There is no evidence of fracture or dislocation. There is no evidence of arthropathy or other focal bone abnormality. Soft tissues are unremarkable. IMPRESSION: Negative. Electronically Signed   By: Amie Portland M.D.   On: 07/16/2022 13:06    Procedures Procedures (including critical care time)  Medications Ordered in UC Medications  acetaminophen (TYLENOL) tablet 650 mg (650 mg Oral Given 07/16/22 1230)    Initial Impression / Assessment and Plan / UC Course  I have reviewed the triage vital signs and the nursing notes.  Pertinent labs & imaging results that were available during my care of the patient were reviewed by me and considered in my medical decision making (see chart for details).     The cystic structure on the top of the foot appears to be hematoma.  Uncertain why he had the sudden onset of this, I suspect he bumped it and does not remember. COVID instructions given Final Clinical  Impressions(s) / UC Diagnoses   Final diagnoses:  COVID-19  Foot injury, left, initial encounter  Hematoma     Discharge Instructions      Use ice to  the bump on your foot for 20 minutes every couple of hours Take ibuprofen 3 times a day with food Rest and push fluids You must quarantine at home for 5 days.  You must wear a mask for 10 days. Call for problems   ED Prescriptions     Medication Sig Dispense Auth. Provider   ibuprofen (ADVIL) 800 MG tablet Take 1 tablet (800 mg total) by mouth every 8 (eight) hours as needed for moderate pain. 30 tablet Raylene Everts, MD      PDMP not reviewed this encounter.   Raylene Everts, MD 07/16/22 [phone]

## 2022-07-16 NOTE — Discharge Instructions (Addendum)
Use ice to the bump on your foot for 20 minutes every couple of hours Take ibuprofen 3 times a day with food Rest and push fluids You must quarantine at home for 5 days.  You must wear a mask for 10 days. Call for problems

## 2022-07-17 ENCOUNTER — Telehealth: Payer: Self-pay | Admitting: Emergency Medicine

## 2022-07-17 NOTE — Telephone Encounter (Signed)
Spoke with patient states that he is doing better, the swelling has subsided some.  If no better in a couple of days will follow up with PCP.

## 2022-07-21 ENCOUNTER — Encounter: Payer: 59 | Admitting: Internal Medicine

## 2022-07-27 ENCOUNTER — Encounter: Payer: 59 | Admitting: Nurse Practitioner

## 2022-07-27 NOTE — Progress Notes (Deleted)
Complete Physical  Assessment and Plan: Health Maintenance- Discussed STD testing, safe sex, alcohol and drug awareness, drinking and driving dangers, wearing a seat belt and general safety measures for young adult. Micheal Taylor was seen today for annual exam.  Diagnoses and all orders for this visit:  Encounter for general medical examination       - Due annually  Hyperlipidemia Currently trying to control with diet and exercise -Lipid Panel  Abnormal Thyroid Test -TSH  Screening, anemia, deficiency, iron -     CBC with Differential/Platelet  Screening for diabetes Mellitus - A1c  Vitamin D deficiency Continue Vit D supplementation to maintain value in therapeutic level of 60-100  - VIT D  Medication Management -CMP - CBC - Magnesium  Screening for hematuria/proteinuria - Routine UA with reflex microscopic  Discussed med's effects and SE's. Screening labs and tests as requested with regular follow-up as recommended. Over 40 minutes of exam, counseling, chart review and critical decision making was performed   HPI  This very nice 21 y.o.male presents for complete physical.  Patient has no major health issues.  Patient reports no complaints at this time.  BMI is There is no height or weight on file to calculate BMI., he has been working on diet and exercise. Wt Readings from Last 3 Encounters:  07/16/22 175 lb (79.4 kg)  07/07/22 175 lb (79.4 kg)  06/20/22 180 lb 9.6 oz (81.9 kg)    He does workout. Pt works at [employer] in [employer] 5 days a week Finally, patient has history of Vitamin D Deficiency and last vitamin D was  Lab Results  Component Value Date   VD25OH 18 (L) 07/24/2015  .  Currently on supplementation  Current Medications:  Current Outpatient Medications on File Prior to Visit  Medication Sig Dispense Refill   albuterol (VENTOLIN HFA) 108 (90 Base) MCG/ACT inhaler Inhale 1-2 puffs into the lungs every 6 (six) hours as needed for wheezing or shortness of  breath. 18 g 2   cetirizine (ZYRTEC ALLERGY) 10 MG tablet Take 1 tablet (10 mg total) by mouth at bedtime. 30 tablet 2   fluticasone (FLONASE) 50 MCG/ACT nasal spray Place 1-2 sprays into both nostrils 2 (two) times daily as needed for allergies or rhinitis. 16 g 1   ibuprofen (ADVIL) 800 MG tablet Take 1 tablet (800 mg total) by mouth every 8 (eight) hours as needed for moderate pain. 30 tablet 0   montelukast (SINGULAIR) 10 MG tablet Take 1 tablet (10 mg total) by mouth at bedtime. For allergies. (Patient not taking: Reported on 07/16/2022) 90 tablet 1   triamcinolone ointment (KENALOG) 0.1 % Apply 1 application topically 2 (two) times daily. (Patient taking differently: Apply 1 application  topically 2 (two) times daily. Using PRN) 80 g 1   No current facility-administered medications on file prior to visit.   Health Maintenance:   Immunization History  Administered Date(s) Administered   DTaP 05/03/2002, 07/12/2002, 09/13/2002, 06/18/2003, 04/03/2006   HPV 9-valent 10/10/2016, 11/29/2016, 04/05/2017   Hepatitis B 11-08-2002, 04/01/2002, 09/13/2002   HiB (PRP-OMP) 05/03/2002, 07/22/2002, 10/10/2002, 03/19/2003   IPV 05/03/2002, 07/12/2002, 12/18/2002, 04/03/2006   Influenza Inj Mdck Quad With Preservative 11/06/2017   Influenza Split 09/30/2014   Influenza, Seasonal, Injecte, Preservative Fre 10/10/2016   MMR 03/19/2003, 04/28/2005   Meningococcal Conjugate 07/01/2014   PFIZER(Purple Top)SARS-COV-2 Vaccination 02/20/2020, 03/16/2020   Pneumococcal-Unspecified 05/03/2002, 09/13/2002   Tdap 03/15/2013   Varicella 03/19/2003    TD/TDAP:2014 Influenza: 2018 Pneumovax: N/A HPV vaccines: 3/3 Covid:  Pfizer 02/20/20, 03/16/20 Sexually Active: yes STD testing today Last Dental Exam: 2021 Dr. Orma Render dental Last Eye Exam None - no glasses  Allergies: No Known Allergies Medical History:  has Childhood asthma; Eczema; Environmental allergies; and Acne on their problem list. Surgical History:   He  has a past surgical history that includes No past surgeries and Tonsillectomy and adenoidectomy (Bilateral, 06/03/2019). Family History:  Hisfamily history includes Alzheimer's disease in his paternal grandfather; Dementia in his maternal grandfather; Hypertension in his maternal grandmother and mother. Social History:   reports that he has never smoked. He has never used smokeless tobacco. He reports that he does not drink alcohol and does not use drugs.  Review of Systems: Review of Systems  Constitutional:  Negative for chills and fever.  HENT:  Negative for congestion, hearing loss, nosebleeds, sinus pain and tinnitus.   Eyes:  Negative for blurred vision and double vision.  Respiratory:  Negative for cough, shortness of breath and wheezing.   Cardiovascular:  Negative for chest pain, palpitations, orthopnea and leg swelling.  Gastrointestinal:  Negative for abdominal pain, constipation, heartburn, nausea and vomiting.  Genitourinary:  Negative for dysuria and urgency.  Skin:  Negative for rash.  Neurological:  Negative for dizziness, tingling, weakness and headaches.  Endo/Heme/Allergies:  Does not bruise/bleed easily.  Psychiatric/Behavioral:  Negative for depression and suicidal ideas. The patient does not have insomnia.     Physical Exam: Estimated body mass index is 28.68 kg/m as calculated from the following:   Height as of 07/16/22: 5' 5.5" (1.664 m).   Weight as of 07/16/22: 175 lb (79.4 kg). There were no vitals taken for this visit. General Appearance: Well nourished, in no apparent distress.  Eyes: PERRLA, EOMs, conjunctiva no swelling or erythema, normal fundi and vessels.  Sinuses: No Frontal/maxillary tenderness  ENT/Mouth: Ext aud canals clear, normal light reflex with TMs without erythema, bulging. Good dentition. No erythema, swelling, or exudate on post pharynx. Tonsils not swollen or erythematous. Hearing normal.  Neck: Supple, thyroid normal. No bruits   Respiratory: Respiratory effort normal, BS equal bilaterally without rales, rhonchi, wheezing or stridor.  Cardio: RRR without murmurs, rubs or gallops. Brisk peripheral pulses without edema.  Chest: symmetric, with normal excursions and percussion.  Abdomen: Soft, nontender, no guarding, rebound, hernias, masses, or organomegaly.  Lymphatics: Non tender without lymphadenopathy.  Genitourinary: defer Musculoskeletal: Full ROM all peripheral extremities,5/5 strength, and normal gait.  Skin: Warm, dry without rashes, lesions, ecchymosis. Neuro: Cranial nerves intact, reflexes equal bilaterally. Normal muscle tone, no cerebellar symptoms. Sensation intact.  Psych: Awake and oriented X 3, normal affect, Insight and Judgment appropriate.   EKG: Normal sinus rhythm  DANA E WILKINSON 8:33 AM [facility]

## 2022-08-12 ENCOUNTER — Ambulatory Visit: Payer: Self-pay

## 2022-08-17 NOTE — Progress Notes (Signed)
Complete Physical  Assessment and Plan: Health Maintenance- Discussed STD testing, safe sex, alcohol and drug awareness, drinking and driving dangers, wearing a seat belt and general safety measures for young adult. Micheal Taylor was seen today for annual exam.  Diagnoses and all orders for this visit:  Encounter for general medical examination       - Due annually  Hyperlipidemia Currently trying to control with diet and exercise -Lipid Panel  Abnormal Thyroid Test Has lost weight without trying Will recheck thyroid panel, if remains abnormal get thyroid U/S -Thyroid panel with TSH  Weight Loss Choose high protein and caloric dense foods.  Will recheck thyroid panel, if remains abnormal will refer to endocrinology  Screening, anemia, deficiency, iron -     CBC with Differential/Platelet  Screening for diabetes Mellitus - A1c  Vitamin D deficiency Continue Vit D supplementation to maintain value in therapeutic level of 60-100  - VIT D  Medication Management -CMP - CBC - Magnesium  Screening for hematuria/proteinuria - Routine UA with reflex microscopic  Discussed med's effects and SE's. Screening labs and tests as requested with regular follow-up as recommended. Over 40 minutes of exam, counseling, chart review and critical decision making was performed   HPI  This very nice 21 y.o.male presents for complete physical.  Patient has no major health issues.  Patient reports no complaints at this time.   BMI is Body mass index is 27.86 kg/m., he has been working on diet and exercise. He has lost 52 pounds in the past year without trying to lose weight or changing diet. Wt Readings from Last 3 Encounters:  08/19/22 170 lb (77.1 kg)  07/16/22 175 lb (79.4 kg)  07/07/22 175 lb (79.4 kg)    BP is well controlled without medication.  Denies headaches, chest pin, shortness of breath and dizziness BP Readings from Last 3 Encounters:  08/19/22 100/60  07/16/22 125/77  07/07/22  112/64   He did have abnormal thyroid test at last visit for the first time.  Will recheck today.  Lab Results  Component Value Date   TSH 0.39 (L) 06/20/2022    He does workout. Pt works at [employer] in [employer] 5 days a week Finally, patient has history of Vitamin D Deficiency and last vitamin D was  Lab Results  Component Value Date   VD25OH 18 (L) 07/24/2015  .  Currently on supplementation  Current Medications:  Current Outpatient Medications on File Prior to Visit  Medication Sig Dispense Refill   albuterol (VENTOLIN HFA) 108 (90 Base) MCG/ACT inhaler Inhale 1-2 puffs into the lungs every 6 (six) hours as needed for wheezing or shortness of breath. 18 g 2   Cholecalciferol (VITAMIN D) 125 MCG (5000 UT) CAPS Take by mouth.     fluticasone (FLONASE) 50 MCG/ACT nasal spray Place 1-2 sprays into both nostrils 2 (two) times daily as needed for allergies or rhinitis. 16 g 1   ibuprofen (ADVIL) 800 MG tablet Take 1 tablet (800 mg total) by mouth every 8 (eight) hours as needed for moderate pain. 30 tablet 0   Multiple Vitamin (MULTIVITAMIN) tablet Take 1 tablet by mouth daily.     triamcinolone ointment (KENALOG) 0.1 % Apply 1 application topically 2 (two) times daily. (Patient taking differently: Apply 1 application  topically 2 (two) times daily. Using PRN) 80 g 1   cetirizine (ZYRTEC ALLERGY) 10 MG tablet Take 1 tablet (10 mg total) by mouth at bedtime. 30 tablet 2   montelukast (SINGULAIR) 10 MG  tablet Take 1 tablet (10 mg total) by mouth at bedtime. For allergies. (Patient not taking: Reported on 07/16/2022) 90 tablet 1   No current facility-administered medications on file prior to visit.   Health Maintenance:   Immunization History  Administered Date(s) Administered   DTaP 05/03/2002, 07/12/2002, 09/13/2002, 06/18/2003, 04/03/2006   HIB (PRP-OMP) 05/03/2002, 07/22/2002, 10/10/2002, 03/19/2003   HPV 9-valent 10/10/2016, 11/29/2016, 04/05/2017   Hepatitis B 09-09-02, 04/01/2002,  09/13/2002   IPV 05/03/2002, 07/12/2002, 12/18/2002, 04/03/2006   Influenza Inj Mdck Quad With Preservative 11/06/2017   Influenza Split 09/30/2014   Influenza, Seasonal, Injecte, Preservative Fre 10/10/2016   MMR 03/19/2003, 04/28/2005   Meningococcal Conjugate 07/01/2014   PFIZER(Purple Top)SARS-COV-2 Vaccination 02/20/2020, 03/16/2020   Pneumococcal-Unspecified 05/03/2002, 09/13/2002   Tdap 03/15/2013   Varicella 03/19/2003    TD/TDAP:2014 Influenza: 2018 Pneumovax: N/A HPV vaccines: 3/3 Covid: [place] 02/20/20, 03/16/20 Sexually Active: yes STD testing today Last Dental Exam: 2021 Dr. Orma Render dental Last Eye Exam None - no glasses  Allergies: No Known Allergies Medical History:  has Childhood asthma; Eczema; Environmental allergies; and Acne on their problem list. Surgical History:  He  has a past surgical history that includes No past surgeries and Tonsillectomy and adenoidectomy (Bilateral, 06/03/2019). Family History:  Hisfamily history includes Alzheimer's disease in his paternal grandfather; Dementia in his maternal grandfather; Hypertension in his maternal grandmother and mother. Social History:   reports that he has never smoked. He has never used smokeless tobacco. He reports that he does not drink alcohol and does not use drugs.  Review of Systems: Review of Systems  Constitutional:  Positive for weight loss. Negative for chills and fever.  HENT:  Negative for congestion, hearing loss, nosebleeds, sinus pain and tinnitus.   Eyes:  Negative for blurred vision and double vision.  Respiratory:  Negative for cough, shortness of breath and wheezing.   Cardiovascular:  Negative for chest pain, palpitations, orthopnea and leg swelling.  Gastrointestinal:  Negative for abdominal pain, constipation, heartburn, nausea and vomiting.  Genitourinary:  Negative for dysuria and urgency.  Skin:  Negative for rash.  Neurological:  Negative for dizziness, tingling, weakness and  headaches.  Endo/Heme/Allergies:  Does not bruise/bleed easily.  Psychiatric/Behavioral:  Negative for depression and suicidal ideas. The patient does not have insomnia.     Physical Exam: Estimated body mass index is 27.86 kg/m as calculated from the following:   Height as of this encounter: 5' 5.5" (1.664 m).   Weight as of this encounter: 170 lb (77.1 kg). BP 100/60   Pulse (!) 53   Temp (!) 97.5 F (36.4 C)   Ht 5' 5.5" (1.664 m)   Wt 170 lb (77.1 kg)   SpO2 97%   BMI 27.86 kg/m  General Appearance: Well nourished, in no apparent distress.  Eyes: PERRLA, EOMs, conjunctiva no swelling or erythema, normal fundi and vessels.  Sinuses: No Frontal/maxillary tenderness  ENT/Mouth: Ext aud canals clear, normal light reflex with TMs without erythema, bulging. Good dentition. No erythema, swelling, or exudate on post pharynx. Tonsils not swollen or erythematous. Hearing normal.  Neck: Supple, thyroid normal. No bruits  Respiratory: Respiratory effort normal, BS equal bilaterally without rales, rhonchi, wheezing or stridor.  Cardio: RRR without murmurs, rubs or gallops. Brisk peripheral pulses without edema.  Chest: symmetric, with normal excursions and percussion.  Abdomen: Soft, nontender, no guarding, rebound, hernias, masses, or organomegaly.  Lymphatics: Non tender without lymphadenopathy.  Genitourinary: defer Musculoskeletal: Full ROM all peripheral extremities,5/5 strength, and normal gait.  Skin:  Warm, dry without rashes, lesions, ecchymosis. Neuro: Cranial nerves intact, reflexes equal bilaterally. Normal muscle tone, no cerebellar symptoms. Sensation intact.  Psych: Awake and oriented X 3, normal affect, Insight and Judgment appropriate.   EKG: Defer  [place] 10:14 AM [facility]

## 2022-08-19 ENCOUNTER — Ambulatory Visit (INDEPENDENT_AMBULATORY_CARE_PROVIDER_SITE_OTHER): Payer: 59 | Admitting: Nurse Practitioner

## 2022-08-19 ENCOUNTER — Encounter: Payer: Self-pay | Admitting: Nurse Practitioner

## 2022-08-19 VITALS — BP 100/60 | HR 53 | Temp 97.5°F | Ht 65.5 in | Wt 170.0 lb

## 2022-08-19 DIAGNOSIS — Z1152 Encounter for screening for COVID-19: Secondary | ICD-10-CM

## 2022-08-19 DIAGNOSIS — Z0000 Encounter for general adult medical examination without abnormal findings: Secondary | ICD-10-CM

## 2022-08-19 DIAGNOSIS — Z79899 Other long term (current) drug therapy: Secondary | ICD-10-CM

## 2022-08-19 DIAGNOSIS — Z1389 Encounter for screening for other disorder: Secondary | ICD-10-CM

## 2022-08-19 DIAGNOSIS — Z131 Encounter for screening for diabetes mellitus: Secondary | ICD-10-CM

## 2022-08-19 DIAGNOSIS — R7989 Other specified abnormal findings of blood chemistry: Secondary | ICD-10-CM

## 2022-08-19 DIAGNOSIS — R634 Abnormal weight loss: Secondary | ICD-10-CM

## 2022-08-19 DIAGNOSIS — E782 Mixed hyperlipidemia: Secondary | ICD-10-CM

## 2022-08-19 DIAGNOSIS — E559 Vitamin D deficiency, unspecified: Secondary | ICD-10-CM

## 2022-08-20 LAB — COMPLETE METABOLIC PANEL WITH GFR
AG Ratio: 1.6 (calc) (ref 1.0–2.5)
ALT: 13 U/L (ref 9–46)
AST: 20 U/L (ref 10–40)
Albumin: 4.1 g/dL (ref 3.6–5.1)
Alkaline phosphatase (APISO): 51 U/L (ref 36–130)
BUN/Creatinine Ratio: 24 (calc) — ABNORMAL HIGH (ref 6–22)
BUN: 27 mg/dL — ABNORMAL HIGH (ref 7–25)
CO2: 25 mmol/L (ref 20–32)
Calcium: 9.7 mg/dL (ref 8.6–10.3)
Chloride: 106 mmol/L (ref 98–110)
Creat: 1.14 mg/dL (ref 0.60–1.24)
Globulin: 2.6 g/dL (calc) (ref 1.9–3.7)
Glucose, Bld: 84 mg/dL (ref 65–99)
Potassium: 4.2 mmol/L (ref 3.5–5.3)
Sodium: 140 mmol/L (ref 135–146)
Total Bilirubin: 0.9 mg/dL (ref 0.2–1.2)
Total Protein: 6.7 g/dL (ref 6.1–8.1)
eGFR: 94 mL/min/{1.73_m2} (ref >=60)

## 2022-08-20 LAB — CBC WITH DIFFERENTIAL/PLATELET
Absolute Monocytes: 704 cells/uL (ref 200–950)
Basophils Absolute: 32 cells/uL (ref 0–200)
Basophils Relative: 0.5 %
Eosinophils Absolute: 275 cells/uL (ref 15–500)
Eosinophils Relative: 4.3 %
HCT: 45.7 % (ref 38.5–50.0)
Hemoglobin: 15.5 g/dL (ref 13.2–17.1)
Lymphs Abs: 2643 cells/uL (ref 850–3900)
MCH: 30.4 pg (ref 27.0–33.0)
MCHC: 33.9 g/dL (ref 32.0–36.0)
MCV: 89.6 fL (ref 80.0–100.0)
MPV: 10 fL (ref 7.5–12.5)
Monocytes Relative: 11 %
Neutro Abs: 2746 cells/uL (ref 1500–7800)
Neutrophils Relative %: 42.9 %
Platelets: 263 10*3/uL (ref 140–400)
RBC: 5.1 10*6/uL (ref 4.20–5.80)
RDW: 12.3 % (ref 11.0–15.0)
Total Lymphocyte: 41.3 %
WBC: 6.4 10*3/uL (ref 3.8–10.8)

## 2022-08-20 LAB — HEMOGLOBIN A1C
Hgb A1c MFr Bld: 5.6 % of total Hgb (ref <5.7)
Mean Plasma Glucose: 114 mg/dL
eAG (mmol/L): 6.3 mmol/L

## 2022-08-20 LAB — VITAMIN D 25 HYDROXY (VIT D DEFICIENCY, FRACTURES): Vit D, 25-Hydroxy: 21 ng/mL — ABNORMAL LOW (ref 30–100)

## 2022-08-20 LAB — THYROID PANEL WITH TSH
Free Thyroxine Index: 2.7 (ref 1.4–3.8)
T3 Uptake: 36 % — ABNORMAL HIGH (ref 22–35)
T4, Total: 7.4 ug/dL (ref 5.1–10.3)
TSH: 0.66 mIU/L (ref 0.40–4.50)

## 2022-08-20 LAB — LIPID PANEL
Cholesterol: 156 mg/dL (ref <200)
HDL: 52 mg/dL (ref >=40)
LDL Cholesterol (Calc): 88 mg/dL (calc)
Non-HDL Cholesterol (Calc): 104 mg/dL (calc) (ref <130)
Total CHOL/HDL Ratio: 3 (calc) (ref <5.0)
Triglycerides: 73 mg/dL (ref <150)

## 2022-08-20 LAB — MAGNESIUM: Magnesium: 2.2 mg/dL (ref 1.5–2.5)

## 2022-09-07 ENCOUNTER — Encounter: Payer: Self-pay | Admitting: Nurse Practitioner

## 2022-09-08 ENCOUNTER — Other Ambulatory Visit: Payer: Self-pay | Admitting: Nurse Practitioner

## 2022-09-08 DIAGNOSIS — Q386 Other congenital malformations of mouth: Secondary | ICD-10-CM

## 2023-04-27 ENCOUNTER — Ambulatory Visit
Admission: RE | Admit: 2023-04-27 | Discharge: 2023-04-27 | Disposition: A | Discharge status: Home / Self Care | Payer: 59 | Source: Ambulatory Visit | Attending: Internal Medicine | Admitting: Internal Medicine

## 2023-04-27 VITALS — BP 111/72 | HR 110 | Temp 98.8°F | Resp 16

## 2023-04-27 DIAGNOSIS — A084 Viral intestinal infection, unspecified: Secondary | ICD-10-CM

## 2023-04-27 MED ORDER — ONDANSETRON 4 MG PO TBDP
4.0000 mg | ORAL_TABLET | Freq: Once | ORAL | Status: AC
  Administered 2023-04-27: 4 mg via ORAL

## 2023-04-27 MED ORDER — ONDANSETRON 4 MG PO TBDP: 4.0000 mg | ORAL_TABLET | Freq: Three times a day (TID) | ORAL | 0 refills | Status: DC | PRN

## 2023-04-27 MED ORDER — ONDANSETRON 4 MG PO TBDP: 4.0000 mg | ORAL_TABLET | Freq: Once | ORAL | Status: DC

## 2023-04-27 NOTE — Discharge Instructions (Signed)

## 2023-04-27 NOTE — ED Provider Notes (Signed)
[facility]    CSN: [id number] Arrival date & time: 04/27/23  1023      History   Chief Complaint Chief Complaint  Patient presents with   Nausea   Diarrhea   Emesis    HPI Micheal Taylor is a 22 y.o. male.   Patient presents to urgent care for evaluation of nausea, vomiting, diarrhea, and generalized abdominal pain that started  yesterday around 4pm. He has vomited multiple times over the last 24 hours and multiple episodes of diarrhea as well. Non-bloody/non-bilious emesis. No known sick contacts. No fever, chills, sore throat, rash, cough, or recent antibiotic/steroid use. No history of diabetes or immunosuppression. No history of abdominal surgeries. He has been unable to keep down water and soup over the last 24 hours and states he fees dehydrated. Last episode of emesis was shortly prior to arrival at urgent care and describes emesis as "liquid".  Denies recent intake of foods outside of normal diet, tick bites, or recent travel/water from an unknown source.  He has not attempted use of any over-the-counter medications to help with symptoms before coming to urgent care.   Diarrhea Associated symptoms: vomiting   Emesis Associated symptoms: diarrhea     Past Medical History:  Diagnosis Date   Asthma     Patient Active Problem List   Diagnosis Date Noted   Acne 05/09/2018   Environmental allergies 12/02/2014   Eczema 09/30/2014   Childhood asthma 05/08/2011    Past Surgical History:  Procedure Laterality Date   NO PAST SURGERIES     TONSILLECTOMY AND ADENOIDECTOMY Bilateral 06/03/2019   Procedure: TONSILLECTOMY AND ADENOIDECTOMY;  Surgeon: Drema Halon, MD;  Location: [facility];  Service: ENT;  Laterality: Bilateral;       Home Medications    Prior to Admission medications   Medication Sig Start Date End Date Taking? Authorizing Provider  ondansetron (ZOFRAN-ODT) 4 MG disintegrating tablet Take 1 tablet (4 mg total)  by mouth every 8 (eight) hours as needed for nausea or vomiting. 04/27/23  Yes Carlisle Beers, FNP  albuterol (VENTOLIN HFA) 108 (90 Base) MCG/ACT inhaler Inhale 1-2 puffs into the lungs every 6 (six) hours as needed for wheezing or shortness of breath. 10/13/21   [name], NP  cetirizine (ZYRTEC ALLERGY) 10 MG tablet Take 1 tablet (10 mg total) by mouth at bedtime. 10/20/20 07/16/22  Elder Negus, NP  Cholecalciferol (VITAMIN D) 125 MCG (5000 UT) CAPS Take by mouth.    [provider]  fluticasone (FLONASE) 50 MCG/ACT nasal spray Place 1-2 sprays into both nostrils 2 (two) times daily as needed for allergies or rhinitis. 03/23/21   Judd Gaudier, NP  ibuprofen (ADVIL) 800 MG tablet Take 1 tablet (800 mg total) by mouth every 8 (eight) hours as needed for moderate pain. 07/16/22   Eustace Moore, MD  Multiple Vitamin (MULTIVITAMIN) tablet Take 1 tablet by mouth daily.    [provider]  triamcinolone ointment (KENALOG) 0.1 % Apply 1 application topically 2 (two) times daily. Patient taking differently: Apply 1 application  topically 2 (two) times daily. Using PRN 02/09/21   Elder Negus, NP    Family History Family History  Problem Relation Age of Onset   Hypertension Mother    Hypertension Maternal Grandmother    Dementia Maternal Grandfather    Alzheimer's disease Paternal Grandfather     Social History Social History   Tobacco Use   Smoking status: Never   Smokeless tobacco:  Never  Vaping Use   Vaping Use: Never used  Substance Use Topics   Alcohol use: No    Alcohol/week: 0.0 standard drinks of alcohol   Drug use: No     Allergies   Patient has no known allergies.   Review of Systems Review of Systems  Gastrointestinal:  Positive for diarrhea and vomiting.  Per HPI   Physical Exam Triage Vital Signs ED Triage Vitals [04/27/23 1031]  Enc Vitals Group     BP 111/72     Pulse Rate (!) 110     Resp 16     Temp 98.8 F (37.1  C)     Temp src      SpO2 98 %     Weight      Height      Head Circumference      Peak Flow      Pain Score 7     Pain Loc      Pain Edu?      Excl. in GC?    No data found.  Updated Vital Signs BP 111/72   Pulse (!) 110   Temp 98.8 F (37.1 C)   Resp 16   SpO2 98%   Visual Acuity Right Eye Distance:   Left Eye Distance:   Bilateral Distance:    Right Eye Near:   Left Eye Near:    Bilateral Near:     Physical Exam Vitals and nursing note reviewed.  Constitutional:      Appearance: He is not ill-appearing or toxic-appearing.  HENT:     Head: Normocephalic and atraumatic.     Right Ear: Hearing and external ear normal.     Left Ear: Hearing and external ear normal.     Nose: Nose normal.     Mouth/Throat:     Lips: Pink.     Mouth: Mucous membranes are moist.     Pharynx: No posterior oropharyngeal erythema.  Eyes:     General: Lids are normal. Vision grossly intact. Gaze aligned appropriately.     Extraocular Movements: Extraocular movements intact.     Conjunctiva/sclera: Conjunctivae normal.  Cardiovascular:     Rate and Rhythm: Normal rate and regular rhythm.     Heart sounds: Normal heart sounds, S1 normal and S2 normal.  Pulmonary:     Effort: Pulmonary effort is normal. No respiratory distress.     Breath sounds: Normal breath sounds and air entry.  Abdominal:     General: Bowel sounds are normal.     Palpations: Abdomen is soft.     Tenderness: There is abdominal tenderness (Generalized). There is no right CVA tenderness, left CVA tenderness or guarding.     Comments: No peritoneal signs to abdominal exam.  Musculoskeletal:     Cervical back: Neck supple.  Skin:    General: Skin is warm and dry.     Capillary Refill: Capillary refill takes less than 2 seconds.     Findings: No rash.  Neurological:     General: No focal deficit present.     Mental Status: He is alert and oriented to person, place, and time. Mental status is at baseline.      Cranial Nerves: No dysarthria or facial asymmetry.  Psychiatric:        Mood and Affect: Mood normal.        Speech: Speech normal.        Behavior: Behavior normal.        Thought Content:  Thought content normal.        Judgment: Judgment normal.      UC Treatments / Results  Labs (all labs ordered are listed, but only abnormal results are displayed) Labs Reviewed - No data to display  EKG   Radiology No results found.  Procedures Procedures (including critical care time)  Medications Ordered in UC Medications  ondansetron (ZOFRAN-ODT) disintegrating tablet 4 mg (4 mg Oral Given 04/27/23 1034)    Initial Impression / Assessment and Plan / UC Course  I have reviewed the triage vital signs and the nursing notes.  Pertinent labs & imaging results that were available during my care of the patient were reviewed by me and considered in my medical decision making (see chart for details).   1.  Viral gastroenteritis Presentation is consistent with acute viral gastroenteritis that will likely improve with rest, increased fluid intake, and as needed use of antiemetics. Abdominal exam is without peritoneal signs, patient is nontoxic in appearance, and vitals are hemodynamically stable. Push fluids with water and pedialyte for rehydration.  Zofran given in clinic for nausea and vomiting with some relief prior to discharge. May use zofran 4mg  ODT every 8 hours as needed for nausea and vomiting. May use Tylenol for abdominal discomfort at home related to viral illness. Bland/liquid diet recommended for 12-24 hours, then increase diet to normal as tolerated.  Work excuse note given.  Discussed physical exam and available lab work findings in clinic with patient.  Counseled patient regarding appropriate use of medications and potential side effects for all medications recommended or prescribed today. Discussed red flag signs and symptoms of worsening condition,when to call the PCP office,  return to urgent care, and when to seek higher level of care in the emergency department. Patient verbalizes understanding and agreement with plan. All questions answered. Patient discharged in stable condition.    Final Clinical Impressions(s) / UC Diagnoses   Final diagnoses:  Viral gastroenteritis     Discharge Instructions      Your evaluation suggests that your symptoms are most likely due to viral stomach illness (gastroenteritis) which will improve on its own with rest and fluids in the next few days.   I have prescribed an antinausea medication for you to take at home called Zofran. It is the same medication that we gave you in the office.  You may use tylenol 1,000mg  every 6 hours over the counter as needed for abdominal discomfort related to this virus.   Eat a bland diet for the next 12-24 hours (bananas, rice, white toast, and applesauce) once you are able to tolerate liquids (broth, etc). These foods are easy for your stomach to digest. Pedialyte can be purchased to help with rehydration. Drink plenty of water.   Please follow up with your primary care provider for further management. Return if you experience worsening or uncontrolled pain, inability to tolerate fluids by mouth, difficulty breathing, fevers 100.35F or greater, recurrent vomiting, or any other concerning symptoms. I hope you feel better!      ED Prescriptions     Medication Sig Dispense Auth. Provider   ondansetron (ZOFRAN-ODT) 4 MG disintegrating tablet Take 1 tablet (4 mg total) by mouth every 8 (eight) hours as needed for nausea or vomiting. 20 tablet Carlisle Beers, FNP      PDMP not reviewed this encounter.   Carlisle Beers, [place] 04/27/23 1137

## 2023-04-27 NOTE — ED Triage Notes (Signed)
Pt presents to uc with co of nausea since yesterday at 4pm. Pt reports he vomited at 8 pm and multiple times since then. Pt has had difficulty keeping anything done.

## 2023-07-17 ENCOUNTER — Encounter: Payer: Self-pay | Admitting: Nurse Practitioner

## 2023-07-18 ENCOUNTER — Ambulatory Visit
Admission: RE | Admit: 2023-07-18 | Discharge: 2023-07-18 | Disposition: A | Discharge status: Home / Self Care | Payer: 59 | Source: Ambulatory Visit | Attending: Family Medicine | Admitting: Family Medicine

## 2023-07-18 ENCOUNTER — Ambulatory Visit: Payer: 59

## 2023-07-18 VITALS — BP 138/82 | HR 70 | Temp 97.8°F | Resp 16 | Ht 66.0 in | Wt 185.0 lb

## 2023-07-18 DIAGNOSIS — R21 Rash and other nonspecific skin eruption: Secondary | ICD-10-CM

## 2023-07-18 DIAGNOSIS — L309 Dermatitis, unspecified: Secondary | ICD-10-CM | POA: Diagnosis not present

## 2023-07-18 MED ORDER — PREDNISONE 10 MG (21) PO TBPK: ORAL_TABLET | Freq: Every day | ORAL | 0 refills | Status: DC

## 2023-07-18 MED ORDER — FLUOCINONIDE EMULSIFIED BASE 0.05 % EX CREA: TOPICAL_CREAM | CUTANEOUS | 0 refills | Status: DC

## 2023-07-18 NOTE — Discharge Instructions (Addendum)
Advised patient to take medication as directed with food to completion.  Encouraged increase daily water intake to 64 ounces per day while taking this medication.  Advised may use latex ointment for eczema flare.  Advised patient to use this ointment on body only and not to use on neck or face.  Advised patient may continue to use OTC aloe vera gel for sunburn.  Advised if symptoms worsen and/or unresolved please follow-up with PCP, dermatology, or here for further evaluation.

## 2023-07-18 NOTE — ED Provider Notes (Signed)
[facility]    CSN: [id number] Arrival date & time: 07/18/23  1203      History   Chief Complaint Chief Complaint  Patient presents with   Medication Refill    Sunburn/eczema flare up - Entered by patient   Rash    HPI Micheal Taylor is a 22 y.o. male.   HPI 22 year old male presents with sunburn and request treatment.  Additionally, reports sunburn has caused eczema flareup on his arms and is requesting refill of eczema cream/ointment, but is unsure of name of medication.  PMH significant for asthma, eczema and acne vulgaris.  Past Medical History:  Diagnosis Date   Asthma     Patient Active Problem List   Diagnosis Date Noted   Acne 05/09/2018   Environmental allergies 12/02/2014   Eczema 09/30/2014   Childhood asthma 05/08/2011    Past Surgical History:  Procedure Laterality Date   NO PAST SURGERIES     TONSILLECTOMY AND ADENOIDECTOMY Bilateral 06/03/2019   Procedure: TONSILLECTOMY AND ADENOIDECTOMY;  Surgeon: Drema Halon, MD;  Location: [facility];  Service: ENT;  Laterality: Bilateral;       Home Medications    Prior to Admission medications   Medication Sig Start Date End Date Taking? Authorizing Provider  Cholecalciferol (VITAMIN D) 125 MCG (5000 UT) CAPS Take by mouth.   Yes [provider]  fluocinonide-emollient (LIDEX-E) 0.05 % cream Apply sparingly to affected areas twice daily, as needed for eczema.  Do not use on neck or face. 07/18/23  Yes Trevor Iha, FNP  fluticasone (FLONASE) 50 MCG/ACT nasal spray Place 1-2 sprays into both nostrils 2 (two) times daily as needed for allergies or rhinitis. 03/23/21  Yes Judd Gaudier, NP  ibuprofen (ADVIL) 800 MG tablet Take 1 tablet (800 mg total) by mouth every 8 (eight) hours as needed for moderate pain. 07/16/22  Yes Eustace Moore, MD  Multiple Vitamin (MULTIVITAMIN) tablet Take 1 tablet by mouth daily.   Yes [provider]  ondansetron  (ZOFRAN-ODT) 4 MG disintegrating tablet Take 1 tablet (4 mg total) by mouth every 8 (eight) hours as needed for nausea or vomiting. 04/27/23  Yes Carlisle Beers, FNP  predniSONE (STERAPRED UNI-PAK 21 TAB) 10 MG (21) TBPK tablet Take by mouth daily. Take 6 tabs by mouth daily  for 2 days, then 5 tabs for 2 days, then 4 tabs for 2 days, then 3 tabs for 2 days, 2 tabs for 2 days, then 1 tab by mouth daily for 2 days 07/18/23  Yes Trevor Iha, FNP  triamcinolone ointment (KENALOG) 0.1 % Apply 1 application topically 2 (two) times daily. Patient taking differently: Apply 1 application  topically 2 (two) times daily. Using PRN 02/09/21  Yes McClanahan, Kyra, NP  albuterol (VENTOLIN HFA) 108 (90 Base) MCG/ACT inhaler Inhale 1-2 puffs into the lungs every 6 (six) hours as needed for wheezing or shortness of breath. 10/13/21   [name], NP  cetirizine (ZYRTEC ALLERGY) 10 MG tablet Take 1 tablet (10 mg total) by mouth at bedtime. 10/20/20 07/16/22  Elder Negus, NP    Family History Family History  Problem Relation Age of Onset   Hypertension Mother    Hypertension Maternal Grandmother    Dementia Maternal Grandfather    Alzheimer's disease Paternal Grandfather     Social History Social History   Tobacco Use   Smoking status: Never   Smokeless tobacco: Never  Vaping Use   Vaping status: Never Used  Substance Use Topics   Alcohol use: No    Alcohol/week: 0.0 standard drinks of alcohol   Drug use: No     Allergies   Patient has no known allergies.   Review of Systems Review of Systems  Skin:  Positive for rash.  All other systems reviewed and are negative.    Physical Exam Triage Vital Signs ED Triage Vitals  Encounter Vitals Group     BP 07/18/23 1230 138/82     Systolic BP Percentile --      Diastolic BP Percentile --      Pulse Rate 07/18/23 1230 70     Resp 07/18/23 1230 16     Temp 07/18/23 1230 97.8 F (36.6 C)     Temp Source 07/18/23 1230 Oral     SpO2  07/18/23 1230 98 %     Weight 07/18/23 1232 185 lb (83.9 kg)     Height 07/18/23 1232 5\' 6"  (1.676 m)     Head Circumference --      Peak Flow --      Pain Score 07/18/23 1231 0     Pain Loc --      Pain Education --      Exclude from Growth Chart --    No data found.  Updated Vital Signs BP 138/82 (BP Location: Right Arm)   Pulse 70   Temp 97.8 F (36.6 C) (Oral)   Resp 16   Ht 5\' 6"  (1.676 m)   Wt 185 lb (83.9 kg)   SpO2 98%   BMI 29.86 kg/m       Physical Exam Vitals and nursing note reviewed.  Constitutional:      Appearance: Normal appearance. He is normal weight.  HENT:     Head: Normocephalic and atraumatic.     Mouth/Throat:     Mouth: Mucous membranes are moist.     Pharynx: Oropharynx is clear.  Eyes:     Extraocular Movements: Extraocular movements intact.     Conjunctiva/sclera: Conjunctivae normal.     Pupils: Pupils are equal, round, and reactive to light.  Cardiovascular:     Rate and Rhythm: Normal rate and regular rhythm.     Pulses: Normal pulses.     Heart sounds: Normal heart sounds.  Pulmonary:     Effort: Pulmonary effort is normal.     Breath sounds: Normal breath sounds. No wheezing, rhonchi or rales.  Musculoskeletal:        General: Normal range of motion.     Cervical back: Normal range of motion and neck supple.  Skin:    General: Skin is warm and dry.     Comments: Right upper/lower arm (lateral aspects): Mildly erythematous pruritic macular papular eruption-please see image below  Neurological:     General: No focal deficit present.     Mental Status: He is alert and oriented to person, place, and time. Mental status is at baseline.  Psychiatric:        Mood and Affect: Mood normal.        Behavior: Behavior normal.      UC Treatments / Results  Labs (all labs ordered are listed, but only abnormal results are displayed) Labs Reviewed - No data to display  EKG   Radiology No results found.  Procedures Procedures  (including critical care time)  Medications Ordered in UC Medications - No data to display  Initial Impression / Assessment and Plan / UC Course  I have reviewed the  triage vital signs and the nursing notes.  Pertinent labs & imaging results that were available during my care of the patient were reviewed by me and considered in my medical decision making (see chart for details).     MDM: 1.  Rash and nonspecific skin eruption-Rx Sterapred Unipak (tapering from 60 mg to 10 mg over 10 days); 2.  Eczema, unspecified type Rx'd Lidex 0.05% cream: Apply sparingly to affected areas twice daily as needed for eczema.  Do not use on neck or face. Advised patient to take medication as directed with food to completion.  Encouraged increase daily water intake to 64 ounces per day while taking this medication.  Advised may use latex ointment for eczema flare.  Advised patient to use this ointment on body only and not to use on neck or face.  Advised patient may continue to use OTC aloe vera gel for sunburn.  Advised if symptoms worsen and/or unresolved please follow-up with PCP, dermatology, or here for further evaluation.  Patient discharged home, hemodynamically stable. Final Clinical Impressions(s) / UC Diagnoses   Final diagnoses:  Eczema, unspecified type  Rash and nonspecific skin eruption     Discharge Instructions      Advised patient to take medication as directed with food to completion.  Encouraged increase daily water intake to 64 ounces per day while taking this medication.  Advised may use latex ointment for eczema flare.  Advised patient to use this ointment on body only and not to use on neck or face.  Advised patient may continue to use OTC aloe vera gel for sunburn.  Advised if symptoms worsen and/or unresolved please follow-up with PCP, dermatology, or here for further evaluation.     ED Prescriptions     Medication Sig Dispense Auth. Provider   predniSONE (STERAPRED UNI-PAK 21  TAB) 10 MG (21) TBPK tablet Take by mouth daily. Take 6 tabs by mouth daily  for 2 days, then 5 tabs for 2 days, then 4 tabs for 2 days, then 3 tabs for 2 days, 2 tabs for 2 days, then 1 tab by mouth daily for 2 days 42 tablet Trevor Iha, FNP   fluocinonide-emollient (LIDEX-E) 0.05 % cream Apply sparingly to affected areas twice daily, as needed for eczema.  Do not use on neck or face. 30 g Trevor Iha, FNP      PDMP not reviewed this encounter.   Trevor Iha, FNP 07/18/23 1350

## 2023-07-18 NOTE — ED Triage Notes (Signed)
Patient states that he went to [place] last week and he got sunburn while there.  He feels that the sunburn caused his eczema to flare up on his arms.  Patient is requesting a refill on his eczema cream/ointment.  Unsure of the name of meds.

## 2023-07-31 ENCOUNTER — Encounter: Payer: 59 | Admitting: Nurse Practitioner

## 2023-08-21 NOTE — Progress Notes (Deleted)
Complete Physical  Assessment and Plan: Health Maintenance- Discussed STD testing, safe sex, alcohol and drug awareness, drinking and driving dangers, wearing a seat belt and general safety measures for young adult. Jecory was seen today for annual exam.  Diagnoses and all orders for this visit:  Encounter for general medical examination       - Due annually   Screening, anemia, deficiency, iron -     CBC with Differential/Platelet  Screening for thyroid disorder -     TSH  Vitamin D deficiency -     Insurance does not cover a Vit D blood test.  Instructed to take Vit D3 5000 units daily  Hyperlipidemia Continue diet and exercise -     Lipid panel  Screening for endocrine, metabolic and immunity disorder -     COMPLETE METABOLIC PANEL WITH GFR  Overweight       - Instructed to increase exercise, eat diet rich in fruits /vegetables/fiber, limit processed foods   Discussed med's effects and SE's. Screening labs and tests as requested with regular follow-up as recommended. Over 40 minutes of exam, counseling, chart review and critical decision making was performed   HPI  This very nice 22 y.o.male presents for complete physical.  Patient has no major health issues.  Patient reports no complaints at this time.  BMI is There is no height or weight on file to calculate BMI., he has been working on diet and exercise. Wt Readings from Last 3 Encounters:  07/18/23 185 lb (83.9 kg)  08/19/22 170 lb (77.1 kg)  07/16/22 175 lb (79.4 kg)    He does workout. Pt works at [employer] in [employer] 5 days a week Finally, patient has history of Vitamin D Deficiency and last vitamin D was  Lab Results  Component Value Date   VD25OH 21 (L) 08/19/2022  .  Currently on supplementation  Current Medications:  Current Outpatient Medications on File Prior to Visit  Medication Sig Dispense Refill   albuterol (VENTOLIN HFA) 108 (90 Base) MCG/ACT inhaler Inhale 1-2 puffs into the lungs every 6 (six)  hours as needed for wheezing or shortness of breath. 18 g 2   cetirizine (ZYRTEC ALLERGY) 10 MG tablet Take 1 tablet (10 mg total) by mouth at bedtime. 30 tablet 2   Cholecalciferol (VITAMIN D) 125 MCG (5000 UT) CAPS Take by mouth.     fluocinonide-emollient (LIDEX-E) 0.05 % cream Apply sparingly to affected areas twice daily, as needed for eczema.  Do not use on neck or face. 30 g 0   fluticasone (FLONASE) 50 MCG/ACT nasal spray Place 1-2 sprays into both nostrils 2 (two) times daily as needed for allergies or rhinitis. 16 g 1   ibuprofen (ADVIL) 800 MG tablet Take 1 tablet (800 mg total) by mouth every 8 (eight) hours as needed for moderate pain. 30 tablet 0   Multiple Vitamin (MULTIVITAMIN) tablet Take 1 tablet by mouth daily.     ondansetron (ZOFRAN-ODT) 4 MG disintegrating tablet Take 1 tablet (4 mg total) by mouth every 8 (eight) hours as needed for nausea or vomiting. 20 tablet 0   predniSONE (STERAPRED UNI-PAK 21 TAB) 10 MG (21) TBPK tablet Take by mouth daily. Take 6 tabs by mouth daily  for 2 days, then 5 tabs for 2 days, then 4 tabs for 2 days, then 3 tabs for 2 days, 2 tabs for 2 days, then 1 tab by mouth daily for 2 days 42 tablet 0   triamcinolone ointment (KENALOG) 0.1 %  Apply 1 application topically 2 (two) times daily. (Patient taking differently: Apply 1 application  topically 2 (two) times daily. Using PRN) 80 g 1   No current facility-administered medications on file prior to visit.   Health Maintenance:   Immunization History  Administered Date(s) Administered   DTaP 05/03/2002, 07/12/2002, 09/13/2002, 06/18/2003, 04/03/2006   HIB (PRP-OMP) 05/03/2002, 07/22/2002, 10/10/2002, 03/19/2003   HPV 9-valent 10/10/2016, 11/29/2016, 04/05/2017   Hepatitis B 12/13/01, 04/01/2002, 09/13/2002   IPV 05/03/2002, 07/12/2002, 12/18/2002, 04/03/2006   Influenza Inj Mdck Quad With Preservative 11/06/2017   Influenza Split 09/30/2014   Influenza, Seasonal, Injecte, Preservative Fre  10/10/2016   MMR 03/19/2003, 04/28/2005   Meningococcal Conjugate 07/01/2014   PFIZER(Purple Top)SARS-COV-2 Vaccination 02/20/2020, 03/16/2020   Pneumococcal-Unspecified 05/03/2002, 09/13/2002   Tdap 03/15/2013   Varicella 03/19/2003    TD/TDAP:2014 Influenza: 2018 Pneumovax: N/A HPV vaccines: 3/3 Covid: Pfizer 02/20/20, 03/16/20 Sexually Active: yes STD testing today Last Dental Exam: 2021 Dr. Felecia Shelling dental Last Eye Exam None - no glasses  Allergies: No Known Allergies Medical History:  has Childhood asthma; Eczema; Environmental allergies; and Acne on their problem list. Surgical History:  He  has a past surgical history that includes No past surgeries and Tonsillectomy and adenoidectomy (Bilateral, 06/03/2019). Family History:  Hisfamily history includes Alzheimer's disease in his paternal grandfather; Dementia in his maternal grandfather; Hypertension in his maternal grandmother and mother. Social History:   reports that he has never smoked. He has never used smokeless tobacco. He reports that he does not drink alcohol and does not use drugs.  Review of Systems: Review of Systems  Constitutional:  Negative for chills and fever.  HENT:  Negative for congestion, hearing loss, nosebleeds, sinus pain and tinnitus.   Eyes:  Negative for blurred vision and double vision.  Respiratory:  Negative for cough, shortness of breath and wheezing.   Cardiovascular:  Negative for chest pain, palpitations, orthopnea and leg swelling.  Gastrointestinal:  Negative for abdominal pain, constipation, heartburn, nausea and vomiting.  Genitourinary:  Negative for dysuria and urgency.  Skin:  Negative for rash.  Neurological:  Negative for dizziness, tingling, weakness and headaches.  Endo/Heme/Allergies:  Does not bruise/bleed easily.  Psychiatric/Behavioral:  Negative for depression and suicidal ideas. The patient does not have insomnia.     Physical Exam: Estimated body mass index is 29.86 kg/m  as calculated from the following:   Height as of 07/18/23: 5\' 6"  (1.676 m).   Weight as of 07/18/23: 185 lb (83.9 kg). There were no vitals taken for this visit. General Appearance: Well nourished, in no apparent distress.  Eyes: PERRLA, EOMs, conjunctiva no swelling or erythema, normal fundi and vessels.  Sinuses: No Frontal/maxillary tenderness  ENT/Mouth: Ext aud canals clear, normal light reflex with TMs without erythema, bulging. Good dentition. No erythema, swelling, or exudate on post pharynx. Tonsils not swollen or erythematous. Hearing normal.  Neck: Supple, thyroid normal. No bruits  Respiratory: Respiratory effort normal, BS equal bilaterally without rales, rhonchi, wheezing or stridor.  Cardio: RRR without murmurs, rubs or gallops. Brisk peripheral pulses without edema.  Chest: symmetric, with normal excursions and percussion.  Abdomen: Soft, nontender, no guarding, rebound, hernias, masses, or organomegaly.  Lymphatics: Non tender without lymphadenopathy.  Genitourinary: defer Musculoskeletal: Full ROM all peripheral extremities,5/5 strength, and normal gait.  Skin: Warm, dry without rashes, lesions, ecchymosis. Neuro: Cranial nerves intact, reflexes equal bilaterally. Normal muscle tone, no cerebellar symptoms. Sensation intact.  Psych: Awake and oriented X 3, normal affect, Insight and Judgment appropriate.  EKG: Normal sinus rhythm  Ettel Albergo E  12:49 PM [facility]

## 2023-08-22 ENCOUNTER — Encounter: Payer: 59 | Admitting: Nurse Practitioner

## 2023-08-29 IMAGING — DX DG SHOULDER 2+V*L*
3 series · 3 of 3 positions shown · non-contrast
Comparison: None.

CLINICAL DATA: Left shoulder pain

EXAM:
LEFT SHOULDER - 2+ VIEW

[shoulder grashey]
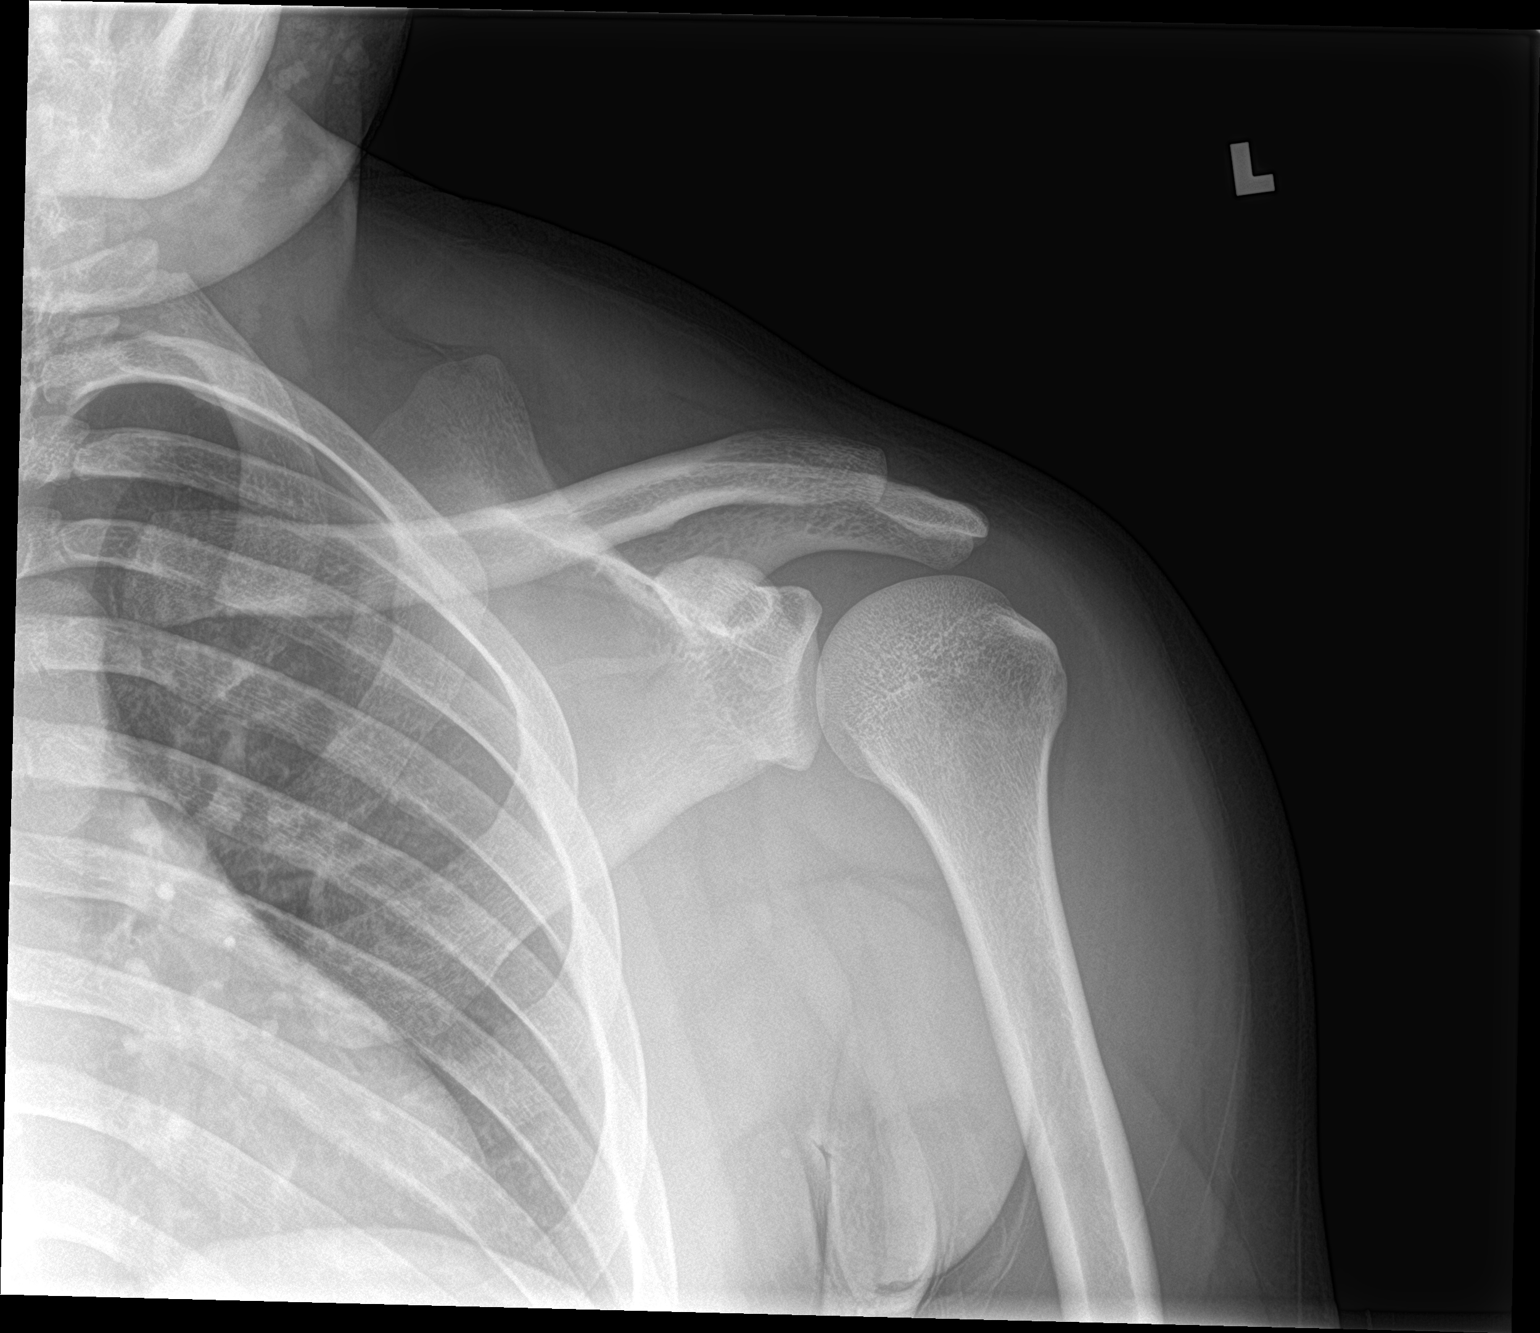

[shoulder y view]
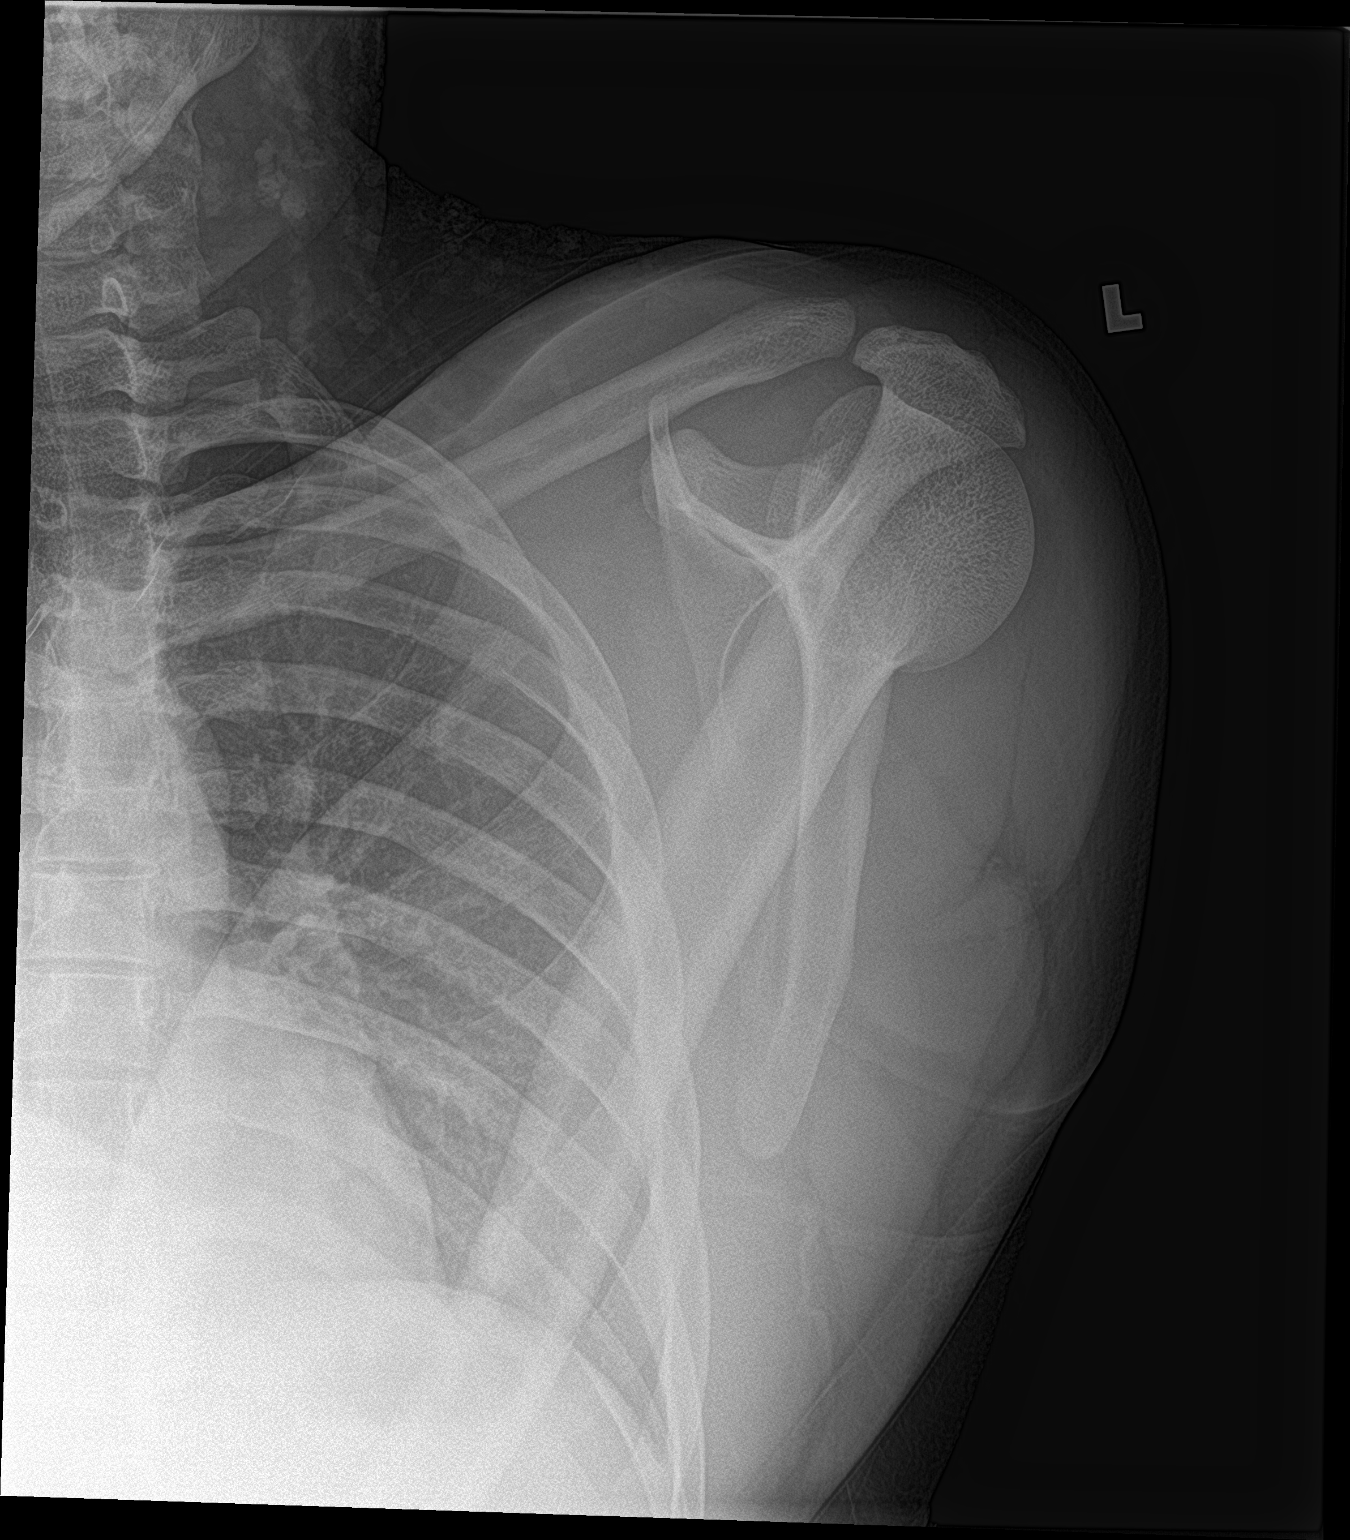

[shoulder axillary]
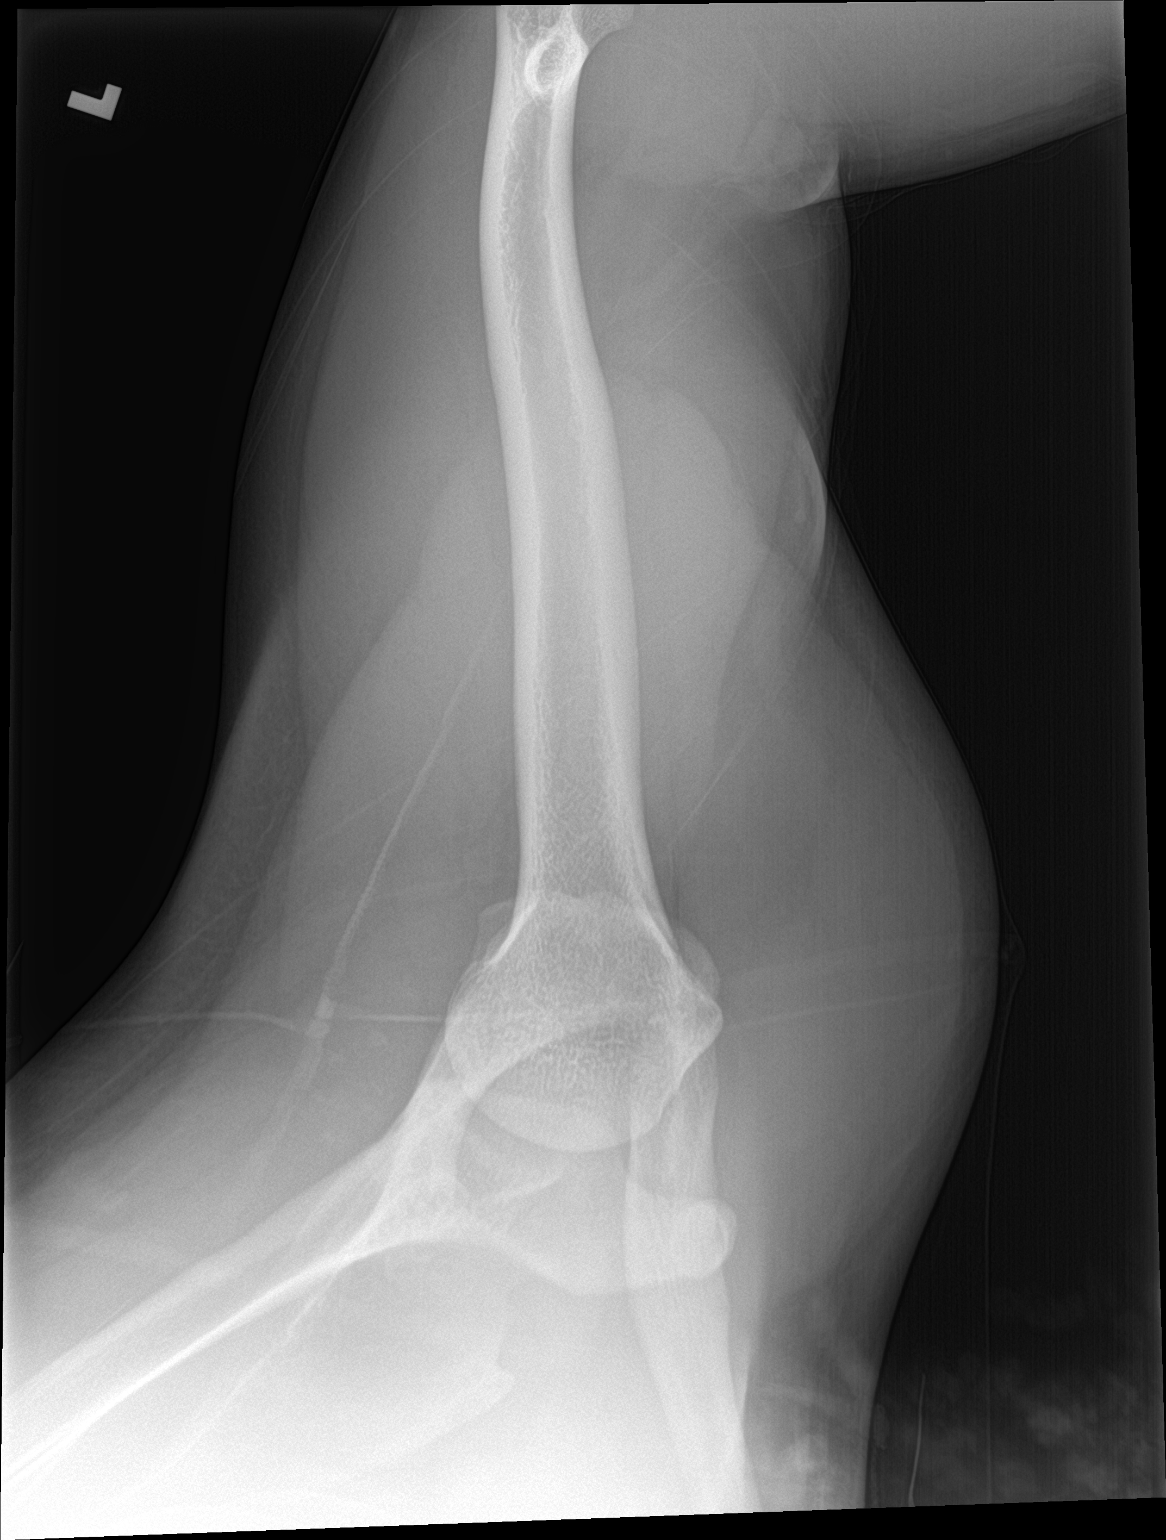

[3 of 3 positions shown; findings below may reference images not displayed]

FINDINGS: There is no evidence of fracture or dislocation. There is no
evidence of arthropathy or other focal bone abnormality. Soft
tissues are unremarkable.
IMPRESSION: Negative.

## 2023-08-29 NOTE — Progress Notes (Unsigned)
Complete Physical  Assessment and Plan: Health Maintenance- Discussed STD testing, safe sex, alcohol and drug awareness, drinking and driving dangers, wearing a seat belt and general safety measures for young adult. Micheal Taylor was seen today for annual exam.  Diagnoses and all orders for this visit:  Encounter for general medical examination       - Due annually   Screening, anemia, deficiency, iron -     CBC with Differential/Platelet  Screening for thyroid disorder -     TSH  Vitamin D deficiency -     Insurance does not cover a Vit D blood test.  Instructed to take Vit D3 5000 units daily  Hyperlipidemia Continue diet and exercise -     Lipid panel  Screening for endocrine, metabolic and immunity disorder -     COMPLETE METABOLIC PANEL WITH GFR  Overweight       - Instructed to increase exercise, eat diet rich in fruits /vegetables/fiber, limit processed foods   Discussed med's effects and SE's. Screening labs and tests as requested with regular follow-up as recommended. Over 40 minutes of exam, counseling, chart review and critical decision making was performed   HPI  This very nice 22 y.o.male presents for complete physical.  Patient has no major health issues.  Patient reports no complaints at this time.  BMI is There is no height or weight on file to calculate BMI., he has been working on diet and exercise. Wt Readings from Last 3 Encounters:  07/18/23 185 lb (83.9 kg)  08/19/22 170 lb (77.1 kg)  07/16/22 175 lb (79.4 kg)    He does workout. Pt works at [employer] in [employer] 5 days a week Finally, patient has history of Vitamin D Deficiency and last vitamin D was  Lab Results  Component Value Date   VD25OH 21 (L) 08/19/2022  .  Currently on supplementation  Current Medications:  Current Outpatient Medications on File Prior to Visit  Medication Sig Dispense Refill   albuterol (VENTOLIN HFA) 108 (90 Base) MCG/ACT inhaler Inhale 1-2 puffs into the lungs every 6 (six)  hours as needed for wheezing or shortness of breath. 18 g 2   cetirizine (ZYRTEC ALLERGY) 10 MG tablet Take 1 tablet (10 mg total) by mouth at bedtime. 30 tablet 2   Cholecalciferol (VITAMIN D) 125 MCG (5000 UT) CAPS Take by mouth.     fluocinonide-emollient (LIDEX-E) 0.05 % cream Apply sparingly to affected areas twice daily, as needed for eczema.  Do not use on neck or face. 30 g 0   fluticasone (FLONASE) 50 MCG/ACT nasal spray Place 1-2 sprays into both nostrils 2 (two) times daily as needed for allergies or rhinitis. 16 g 1   ibuprofen (ADVIL) 800 MG tablet Take 1 tablet (800 mg total) by mouth every 8 (eight) hours as needed for moderate pain. 30 tablet 0   Multiple Vitamin (MULTIVITAMIN) tablet Take 1 tablet by mouth daily.     ondansetron (ZOFRAN-ODT) 4 MG disintegrating tablet Take 1 tablet (4 mg total) by mouth every 8 (eight) hours as needed for nausea or vomiting. 20 tablet 0   predniSONE (STERAPRED UNI-PAK 21 TAB) 10 MG (21) TBPK tablet Take by mouth daily. Take 6 tabs by mouth daily  for 2 days, then 5 tabs for 2 days, then 4 tabs for 2 days, then 3 tabs for 2 days, 2 tabs for 2 days, then 1 tab by mouth daily for 2 days 42 tablet 0   triamcinolone ointment (KENALOG) 0.1 %  Apply 1 application topically 2 (two) times daily. (Patient taking differently: Apply 1 application  topically 2 (two) times daily. Using PRN) 80 g 1   No current facility-administered medications on file prior to visit.   Health Maintenance:   Immunization History  Administered Date(s) Administered   DTaP 05/03/2002, 07/12/2002, 09/13/2002, 06/18/2003, 04/03/2006   HIB (PRP-OMP) 05/03/2002, 07/22/2002, 10/10/2002, 03/19/2003   HPV 9-valent 10/10/2016, 11/29/2016, 04/05/2017   Hepatitis B 2002/06/02, 04/01/2002, 09/13/2002   IPV 05/03/2002, 07/12/2002, 12/18/2002, 04/03/2006   Influenza Inj Mdck Quad With Preservative 11/06/2017   Influenza Split 09/30/2014   Influenza, Seasonal, Injecte, Preservative Fre  10/10/2016   MMR 03/19/2003, 04/28/2005   Meningococcal Conjugate 07/01/2014   PFIZER(Purple Top)SARS-COV-2 Vaccination 02/20/2020, 03/16/2020   Pneumococcal-Unspecified 05/03/2002, 09/13/2002   Tdap 03/15/2013   Varicella 03/19/2003    TD/TDAP:2014 Influenza: 2018 Pneumovax: N/A HPV vaccines: 3/3 Covid: Pfizer 02/20/20, 03/16/20 Sexually Active: yes STD testing today Last Dental Exam: 2021 Dr. Felecia Shelling dental Last Eye Exam None - no glasses  Allergies: No Known Allergies Medical History:  has Childhood asthma; Eczema; Environmental allergies; and Acne on their problem list. Surgical History:  He  has a past surgical history that includes No past surgeries and Tonsillectomy and adenoidectomy (Bilateral, 06/03/2019). Family History:  Hisfamily history includes Alzheimer's disease in his paternal grandfather; Dementia in his maternal grandfather; Hypertension in his maternal grandmother and mother. Social History:   reports that he has never smoked. He has never used smokeless tobacco. He reports that he does not drink alcohol and does not use drugs.  Review of Systems: Review of Systems  Constitutional:  Negative for chills and fever.  HENT:  Negative for congestion, hearing loss, nosebleeds, sinus pain and tinnitus.   Eyes:  Negative for blurred vision and double vision.  Respiratory:  Negative for cough, shortness of breath and wheezing.   Cardiovascular:  Negative for chest pain, palpitations, orthopnea and leg swelling.  Gastrointestinal:  Negative for abdominal pain, constipation, heartburn, nausea and vomiting.  Genitourinary:  Negative for dysuria and urgency.  Skin:  Negative for rash.  Neurological:  Negative for dizziness, tingling, weakness and headaches.  Endo/Heme/Allergies:  Does not bruise/bleed easily.  Psychiatric/Behavioral:  Negative for depression and suicidal ideas. The patient does not have insomnia.     Physical Exam: Estimated body mass index is 29.86 kg/m  as calculated from the following:   Height as of 07/18/23: 5\' 6"  (1.676 m).   Weight as of 07/18/23: 185 lb (83.9 kg). There were no vitals taken for this visit. General Appearance: Well nourished, in no apparent distress.  Eyes: PERRLA, EOMs, conjunctiva no swelling or erythema, normal fundi and vessels.  Sinuses: No Frontal/maxillary tenderness  ENT/Mouth: Ext aud canals clear, normal light reflex with TMs without erythema, bulging. Good dentition. No erythema, swelling, or exudate on post pharynx. Tonsils not swollen or erythematous. Hearing normal.  Neck: Supple, thyroid normal. No bruits  Respiratory: Respiratory effort normal, BS equal bilaterally without rales, rhonchi, wheezing or stridor.  Cardio: RRR without murmurs, rubs or gallops. Brisk peripheral pulses without edema.  Chest: symmetric, with normal excursions and percussion.  Abdomen: Soft, nontender, no guarding, rebound, hernias, masses, or organomegaly.  Lymphatics: Non tender without lymphadenopathy.  Genitourinary: defer Musculoskeletal: Full ROM all peripheral extremities,5/5 strength, and normal gait.  Skin: Warm, dry without rashes, lesions, ecchymosis. Neuro: Cranial nerves intact, reflexes equal bilaterally. Normal muscle tone, no cerebellar symptoms. Sensation intact.  Psych: Awake and oriented X 3, normal affect, Insight and Judgment appropriate.  EKG: Normal sinus rhythm  Sina Lucchesi E  12:44 PM [facility]

## 2023-08-30 ENCOUNTER — Ambulatory Visit (INDEPENDENT_AMBULATORY_CARE_PROVIDER_SITE_OTHER): Payer: 59 | Admitting: Nurse Practitioner

## 2023-08-30 ENCOUNTER — Encounter: Payer: Self-pay | Admitting: Nurse Practitioner

## 2023-08-30 VITALS — BP 98/62 | HR 62 | Temp 97.4°F | Ht 65.0 in | Wt 186.4 lb

## 2023-08-30 DIAGNOSIS — Z0000 Encounter for general adult medical examination without abnormal findings: Secondary | ICD-10-CM

## 2023-08-30 DIAGNOSIS — Z1389 Encounter for screening for other disorder: Secondary | ICD-10-CM

## 2023-08-30 DIAGNOSIS — E782 Mixed hyperlipidemia: Secondary | ICD-10-CM

## 2023-08-30 DIAGNOSIS — E669 Obesity, unspecified: Secondary | ICD-10-CM

## 2023-08-30 DIAGNOSIS — L309 Dermatitis, unspecified: Secondary | ICD-10-CM

## 2023-08-30 DIAGNOSIS — Z131 Encounter for screening for diabetes mellitus: Secondary | ICD-10-CM

## 2023-08-30 DIAGNOSIS — R634 Abnormal weight loss: Secondary | ICD-10-CM

## 2023-08-30 DIAGNOSIS — Z79899 Other long term (current) drug therapy: Secondary | ICD-10-CM

## 2023-08-30 DIAGNOSIS — E559 Vitamin D deficiency, unspecified: Secondary | ICD-10-CM

## 2023-08-30 DIAGNOSIS — R7989 Other specified abnormal findings of blood chemistry: Secondary | ICD-10-CM

## 2023-08-30 DIAGNOSIS — Z113 Encounter for screening for infections with a predominantly sexual mode of transmission: Secondary | ICD-10-CM

## 2023-08-30 DIAGNOSIS — Z0001 Encounter for general adult medical examination with abnormal findings: Secondary | ICD-10-CM

## 2023-08-30 NOTE — Patient Instructions (Signed)

## 2023-08-31 LAB — COMPLETE METABOLIC PANEL WITH GFR
AG Ratio: 1.8 (calc) (ref 1.0–2.5)
ALT: 9 U/L (ref 9–46)
AST: 13 U/L (ref 10–40)
Albumin: 4.2 g/dL (ref 3.6–5.1)
Alkaline phosphatase (APISO): 38 U/L (ref 36–130)
BUN: 19 mg/dL (ref 7–25)
CO2: 27 mmol/L (ref 20–32)
Calcium: 9.7 mg/dL (ref 8.6–10.3)
Chloride: 104 mmol/L (ref 98–110)
Creat: 1.14 mg/dL (ref 0.60–1.24)
Globulin: 2.4 g/dL (calc) (ref 1.9–3.7)
Glucose, Bld: 86 mg/dL (ref 65–99)
Potassium: 4.1 mmol/L (ref 3.5–5.3)
Sodium: 138 mmol/L (ref 135–146)
Total Bilirubin: 1.6 mg/dL — ABNORMAL HIGH (ref 0.2–1.2)
Total Protein: 6.6 g/dL (ref 6.1–8.1)
eGFR: 94 mL/min/{1.73_m2} (ref >=60)

## 2023-08-31 LAB — CBC WITH DIFFERENTIAL/PLATELET
Absolute Monocytes: 550 cells/uL (ref 200–950)
Basophils Absolute: 19 cells/uL (ref 0–200)
Basophils Relative: 0.4 %
Eosinophils Absolute: 169 cells/uL (ref 15–500)
Eosinophils Relative: 3.6 %
HCT: 46.3 % (ref 38.5–50.0)
Hemoglobin: 15.6 g/dL (ref 13.2–17.1)
Lymphs Abs: 1744 cells/uL (ref 850–3900)
MCH: 30.7 pg (ref 27.0–33.0)
MCHC: 33.7 g/dL (ref 32.0–36.0)
MCV: 91.1 fL (ref 80.0–100.0)
MPV: 10 fL (ref 7.5–12.5)
Monocytes Relative: 11.7 %
Neutro Abs: 2218 cells/uL (ref 1500–7800)
Neutrophils Relative %: 47.2 %
Platelets: 315 10*3/uL (ref 140–400)
RBC: 5.08 10*6/uL (ref 4.20–5.80)
RDW: 12.5 % (ref 11.0–15.0)
Total Lymphocyte: 37.1 %
WBC: 4.7 10*3/uL (ref 3.8–10.8)

## 2023-08-31 LAB — URINALYSIS, ROUTINE W REFLEX MICROSCOPIC
Bilirubin Urine: NEGATIVE
Glucose, UA: NEGATIVE
Hgb urine dipstick: NEGATIVE
Ketones, ur: NEGATIVE
Leukocytes,Ua: NEGATIVE
Nitrite: NEGATIVE
Protein, ur: NEGATIVE
Specific Gravity, Urine: 1.006 (ref 1.001–1.035)
pH: 6.5 (ref 5.0–8.0)

## 2023-08-31 LAB — RPR: RPR Ser Ql: NONREACTIVE

## 2023-08-31 LAB — HSV(HERPES SIMPLEX VRS) I + II AB-IGG
HSV 1 IGG,TYPE SPECIFIC AB: <0.9 {index}
HSV 2 IGG,TYPE SPECIFIC AB: <0.9 index

## 2023-08-31 LAB — TSH: TSH: 0.35 mIU/L — ABNORMAL LOW (ref 0.40–4.50)

## 2023-08-31 LAB — VITAMIN D 25 HYDROXY (VIT D DEFICIENCY, FRACTURES): Vit D, 25-Hydroxy: 53 ng/mL (ref 30–100)

## 2023-08-31 LAB — HEMOGLOBIN A1C W/OUT EAG: Hgb A1c MFr Bld: 5.7 % of total Hgb — ABNORMAL HIGH (ref <5.7)

## 2023-08-31 LAB — C. TRACHOMATIS/N. GONORRHOEAE RNA
C. trachomatis RNA, TMA: NOT DETECTED
N. gonorrhoeae RNA, TMA: NOT DETECTED

## 2023-08-31 LAB — MAGNESIUM: Magnesium: 2.1 mg/dL (ref 1.5–2.5)

## 2023-08-31 LAB — LIPID PANEL
Cholesterol: 159 mg/dL (ref <200)
HDL: 57 mg/dL (ref >=40)
LDL Cholesterol (Calc): 84 mg/dL (calc)
Non-HDL Cholesterol (Calc): 102 mg/dL (calc) (ref <130)
Total CHOL/HDL Ratio: 2.8 (calc) (ref <5.0)
Triglycerides: 88 mg/dL (ref <150)

## 2023-08-31 LAB — HIV ANTIBODY (ROUTINE TESTING W REFLEX): HIV 1&2 Ab, 4th Generation: NONREACTIVE

## 2023-12-26 NOTE — Progress Notes (Signed)
 Assessment and Plan:  Amias was seen today for acute visit.  Diagnoses and all orders for this visit:  Obesity (BMI 30.0-34.9) Long discussion about weight loss, diet, and exercise Recommended diet heavy in fruits and veggies and low in animal meats, cheeses, and dairy products, appropriate calorie intake Patient will work on decreasing saturated fats and simple carbs.  Increase exercise Follow up at next visit  Premature ejaculation Will refer to urology for further evaluation -     Ambulatory referral to Urology       Further disposition pending results of labs. Discussed med's effects and SE's.   Over 30 minutes of exam, counseling, chart review, and critical decision making was performed.   Future Appointments  Date Time Provider Department Center  08/30/2024 10:30 AM Stephane Ee, NP [facility] None    ------------------------------------------------------------------------------------------------------------------   HPI BP 102/68   Pulse 82   Temp 97.7 F (36.5 C)   Ht 5\' 5"  (1.651 m)   Wt 194 lb 12.8 oz (88.4 kg)   SpO2 98%   BMI 32.42 kg/m   23 y.o.male presents for ongoing issue that started in June that he has been having premature ejaculation. When he was previously in a committed issue he did not have this problem.  Now it has occurred with several different partners.  Denies any urinary issues   BP well controlled without medication BP Readings from Last 3 Encounters:  12/27/23 102/68  08/30/23 98/62  07/18/23 138/82  Denies headaches, chest pain, shortness of breath and dizziness   BMI is Body mass index is 32.42 kg/m., he has not been working on diet and exercise. He is working at [employer] but does try to exercise outside of work as well.  Wt Readings from Last 3 Encounters:  12/27/23 194 lb 12.8 oz (88.4 kg)  08/30/23 186 lb 6.4 oz (84.6 kg)  07/18/23 185 lb (83.9 kg)     Past Medical History:  Diagnosis Date   Asthma      No Known  Allergies  Current Outpatient Medications on File Prior to Visit  Medication Sig   albuterol  (VENTOLIN  HFA) 108 (90 Base) MCG/ACT inhaler Inhale 1-2 puffs into the lungs every 6 (six) hours as needed for wheezing or shortness of breath.   Cholecalciferol (VITAMIN D ) 125 MCG (5000 UT) CAPS Take by mouth.   fluticasone  (FLONASE ) 50 MCG/ACT nasal spray Place 1-2 sprays into both nostrils 2 (two) times daily as needed for allergies or rhinitis.   ibuprofen  (ADVIL ) 800 MG tablet Take 1 tablet (800 mg total) by mouth every 8 (eight) hours as needed for moderate pain.   Multiple Vitamin (MULTIVITAMIN) tablet Take 1 tablet by mouth daily.   triamcinolone  ointment (KENALOG ) 0.1 % Apply 1 application topically 2 (two) times daily. (Patient taking differently: Apply 1 application  topically 2 (two) times daily. Using PRN)   cetirizine  (ZYRTEC  ALLERGY ) 10 MG tablet Take 1 tablet (10 mg total) by mouth at bedtime.   ondansetron  (ZOFRAN -ODT) 4 MG disintegrating tablet Take 1 tablet (4 mg total) by mouth every 8 (eight) hours as needed for nausea or vomiting. (Patient not taking: Reported on 12/27/2023)   No current facility-administered medications on file prior to visit.    ROS: all negative except above.   Physical Exam:  BP 102/68   Pulse 82   Temp 97.7 F (36.5 C)   Ht 5\' 5"  (1.651 m)   Wt 194 lb 12.8 oz (88.4 kg)   SpO2 98%  BMI 32.42 kg/m   General Appearance: Well nourished, in no apparent distress. Eyes: PERRLA, EOMs, conjunctiva no swelling or erythema Neck: Supple, thyroid  normal.  Respiratory: Respiratory effort normal, BS equal bilaterally without rales, rhonchi, wheezing or stridor.  Cardio: RRR with no MRGs. Brisk peripheral pulses without edema.  Abdomen: Soft, + BS.  Non tender, no guarding, rebound, hernias, masses. Lymphatics: Non tender without lymphadenopathy.  Musculoskeletal: Full ROM, 5/5 strength, normal gait.  Skin: Warm, dry without rashes, lesions, ecchymosis.   Neuro: Cranial nerves intact. Normal muscle tone, no cerebellar symptoms. Sensation intact.  Psych: Awake and oriented X 3, normal affect, Insight and Judgment appropriate.     Malai Lady E Sid Greener, NP 1:53 PM [facility]

## 2023-12-27 ENCOUNTER — Encounter: Payer: Self-pay | Admitting: Nurse Practitioner

## 2023-12-27 ENCOUNTER — Ambulatory Visit (INDEPENDENT_AMBULATORY_CARE_PROVIDER_SITE_OTHER): Payer: 59 | Admitting: Nurse Practitioner

## 2023-12-27 VITALS — BP 102/68 | HR 82 | Temp 97.7°F | Ht 65.0 in | Wt 194.8 lb

## 2023-12-27 DIAGNOSIS — F524 Premature ejaculation: Secondary | ICD-10-CM | POA: Diagnosis not present

## 2023-12-27 DIAGNOSIS — E66811 Obesity, class 1: Secondary | ICD-10-CM | POA: Diagnosis not present

## 2024-08-06 ENCOUNTER — Encounter: Payer: Self-pay | Admitting: Family Medicine

## 2024-08-06 ENCOUNTER — Ambulatory Visit: Payer: Self-pay | Admitting: Family Medicine

## 2024-08-06 VITALS — BP 108/72 | HR 59 | Temp 97.8°F | Ht 67.0 in | Wt 213.0 lb

## 2024-08-06 DIAGNOSIS — Z7689 Persons encountering health services in other specified circumstances: Secondary | ICD-10-CM | POA: Diagnosis not present

## 2024-08-06 DIAGNOSIS — G4700 Insomnia, unspecified: Secondary | ICD-10-CM | POA: Insufficient documentation

## 2024-08-06 DIAGNOSIS — Z130 Encounter for screening for diseases of the blood and blood-forming organs and certain disorders involving the immune mechanism: Secondary | ICD-10-CM | POA: Insufficient documentation

## 2024-08-06 MED ORDER — TRAZODONE HCL 50 MG PO TABS: 50.0000 mg | ORAL_TABLET | Freq: Every evening | ORAL | 0 refills | Status: DC | PRN

## 2024-08-06 NOTE — Progress Notes (Signed)
 New Patient Office Visit  Subjective    Patient ID: Micheal Taylor, male    DOB: 02/05/2001  Age: 23 y.o. MRN: [id number]  CC:  Chief Complaint  Patient presents with   Establish Care    HPI Micheal Taylor presents to establish care with this practice. He is new to me. Transferring care from previous PCP due to death.   Asthma: Does not need treatment since childhood.   Insomnia: Has tried melanton  Unable to fall asleep. Since high school. No depression or anxiety.        Outpatient Encounter Medications as of 08/06/2024  Medication Sig   cetirizine  (ZYRTEC  ALLERGY ) 10 MG tablet Take 1 tablet (10 mg total) by mouth at bedtime.   Cholecalciferol (VITAMIN D ) 125 MCG (5000 UT) CAPS Take by mouth.   ibuprofen  (ADVIL ) 800 MG tablet Take 1 tablet (800 mg total) by mouth every 8 (eight) hours as needed for moderate pain.   Multiple Vitamin (MULTIVITAMIN) tablet Take 1 tablet by mouth daily.   traZODone  (DESYREL ) 50 MG tablet Take 1 tablet (50 mg total) by mouth at bedtime as needed for sleep.   triamcinolone  ointment (KENALOG ) 0.1 % Apply 1 application topically 2 (two) times daily. (Patient taking differently: Apply 1 application  topically 2 (two) times daily. Using PRN)   albuterol  (VENTOLIN  HFA) 108 (90 Base) MCG/ACT inhaler Inhale 1-2 puffs into the lungs every 6 (six) hours as needed for wheezing or shortness of breath. (Patient not taking: Reported on 08/06/2024)   fluticasone  (FLONASE ) 50 MCG/ACT nasal spray Place 1-2 sprays into both nostrils 2 (two) times daily as needed for allergies or rhinitis. (Patient not taking: Reported on 08/06/2024)   No facility-administered encounter medications on file as of 08/06/2024.    Past Medical History:  Diagnosis Date   Asthma     Past Surgical History:  Procedure Laterality Date   NO PAST SURGERIES     TONSILLECTOMY AND ADENOIDECTOMY Bilateral 06/03/2019   Procedure: TONSILLECTOMY AND ADENOIDECTOMY;  Surgeon:  Ethyl Lonni BRAVO, MD;  [facility]:  [facility];  Service: ENT;  Laterality: Bilateral;    Family History  Problem Relation Age of Onset   Hypertension Mother    Hypertension Maternal Grandmother    Dementia Maternal Grandfather    Alzheimer's disease Paternal Grandfather     Social History   Socioeconomic History   Marital status: Single    Spouse name: Not on file   Number of children: 0   Years of education: Not on file   Highest education level: Not on file  Occupational History   Not on file  Tobacco Use   Smoking status: Never    Passive exposure: Never   Smokeless tobacco: Never  Vaping Use   Vaping status: Never Used  Substance and Sexual Activity   Alcohol use: Yes    Alcohol/week: 3.0 standard drinks of alcohol    Types: 3 Shots of liquor per week   Drug use: Not Currently    Types: Marijuana   Sexual activity: Yes    Partners: Female    Birth control/protection: None  Other Topics Concern   Not on file  Social History Narrative   Not on file   Social Drivers of Health   Financial Resource Strain: Not on file  Food Insecurity: Not on file  Transportation Needs: Not on file  Physical Activity: Not on file  Stress: Not on file  Social Connections: Unknown (11/20/2022)   Received from [facility]   Social Network    Social Network: Not on file  Intimate Partner Violence: Unknown (11/20/2022)   Received from [facility]   HITS    Physically Hurt: Not on file    Insult or Talk Down To: Not on file    Threaten Physical Harm: Not on file    Scream or Curse: Not on file    ROS      Objective    BP 108/72   Pulse (!) 59   Temp 97.8 F (36.6 C) (Oral)   Ht 5' 7 (1.702 m)   Wt 213 lb (96.6 kg)   SpO2 97%   BMI 33.36 kg/m   Physical Exam Vitals and nursing note reviewed.  Constitutional:      General: He is not in acute distress.    Appearance: Normal appearance.  Cardiovascular:     Rate and Rhythm: Normal rate  and regular rhythm.     Heart sounds: Normal heart sounds.  Pulmonary:     Effort: Pulmonary effort is normal.     Breath sounds: Normal breath sounds.  Skin:    General: Skin is warm and dry.  Neurological:     General: No focal deficit present.     Mental Status: He is alert. Mental status is at baseline.  Psychiatric:        Mood and Affect: Mood normal.        Behavior: Behavior normal.        Thought Content: Thought content normal.        Judgment: Judgment normal.         Assessment & Plan:   Problem List Items Addressed This Visit     Establishing care with new doctor, encounter for - Primary   Insomnia   Reports insomnia since high school. OTC medications do not help. Trazodone  50 mg as needed for sleep. Follow-up for effectiveness at CPE appointment.      Agrees with plan of care discussed.  Questions answered.   Return in about 24 days (around 08/30/2024) for CPE with labs and insomnia.   Darice JONELLE Brownie, FNP

## 2024-08-06 NOTE — Assessment & Plan Note (Signed)
 Reports insomnia since high school. OTC medications do not help. Trazodone  50 mg as needed for sleep. Follow-up for effectiveness at CPE appointment.

## 2024-08-21 ENCOUNTER — Encounter: Payer: 59 | Admitting: Nurse Practitioner

## 2024-08-30 ENCOUNTER — Encounter: Payer: 59 | Admitting: Nurse Practitioner

## 2024-09-02 ENCOUNTER — Ambulatory Visit (INDEPENDENT_AMBULATORY_CARE_PROVIDER_SITE_OTHER): Admitting: Family Medicine

## 2024-09-02 ENCOUNTER — Encounter: Payer: Self-pay | Admitting: Family Medicine

## 2024-09-02 VITALS — BP 110/70 | HR 65 | Temp 98.2°F | Ht 67.0 in | Wt 213.0 lb

## 2024-09-02 DIAGNOSIS — Z13228 Encounter for screening for other metabolic disorders: Secondary | ICD-10-CM | POA: Diagnosis not present

## 2024-09-02 DIAGNOSIS — Z136 Encounter for screening for cardiovascular disorders: Secondary | ICD-10-CM

## 2024-09-02 DIAGNOSIS — Z0000 Encounter for general adult medical examination without abnormal findings: Secondary | ICD-10-CM

## 2024-09-02 DIAGNOSIS — Z130 Encounter for screening for diseases of the blood and blood-forming organs and certain disorders involving the immune mechanism: Secondary | ICD-10-CM

## 2024-09-02 DIAGNOSIS — Z13220 Encounter for screening for lipoid disorders: Secondary | ICD-10-CM | POA: Diagnosis not present

## 2024-09-02 DIAGNOSIS — Z1159 Encounter for screening for other viral diseases: Secondary | ICD-10-CM | POA: Insufficient documentation

## 2024-09-02 NOTE — Progress Notes (Signed)
 Complete physical exam  Patient: Micheal Taylor   DOB: Feb 03, 2001   23 y.o. Male  MRN: [id number]  Subjective:    Chief Complaint  Patient presents with   Annual Exam    With Labs    Micheal Taylor is a 23 y.o. male who presents today for a complete physical exam. He reports consuming a general diet. Home exercise routine includes weight training and some cardio. He generally feels well. He reports sleeping poorly. He does have additional problems to discuss today.   Trazodone  helps to fall asleep quicker, but wakes up and stays awake. Will try meditation.  Will not refill trazodone .     Most recent fall risk assessment:    08/06/2024   12:12 PM  Fall Risk   Falls in the past year? 0  Number falls in past yr: 0  Injury with Fall? 0  Follow up Falls evaluation completed     Most recent depression screenings:    08/06/2024   12:12 PM 05/09/2018    3:46 PM  PHQ 2/9 Scores  PHQ - 2 Score 0 0  PHQ- 9 Score 0     Vision:Within last year and Dental: No current dental problems and Receives regular dental care    Patient Care Team: Booker Darice SAUNDERS, FNP as PCP - General (Family Medicine) Ethyl Lonni BRAVO, MD (Inactive) as Consulting Physician (Otolaryngology)   Outpatient Medications Prior to Visit  Medication Sig Note   albuterol  (VENTOLIN  HFA) 108 (90 Base) MCG/ACT inhaler Inhale 1-2 puffs into the lungs every 6 (six) hours as needed for wheezing or shortness of breath.    cetirizine  (ZYRTEC  ALLERGY ) 10 MG tablet Take 1 tablet (10 mg total) by mouth at bedtime.    Cholecalciferol (VITAMIN D ) 125 MCG (5000 UT) CAPS Take by mouth.    fluticasone  (FLONASE ) 50 MCG/ACT nasal spray Place 1-2 sprays into both nostrils 2 (two) times daily as needed for allergies or rhinitis.    ibuprofen  (ADVIL ) 800 MG tablet Take 1 tablet (800 mg total) by mouth every 8 (eight) hours as needed for moderate pain.    Multiple Vitamin (MULTIVITAMIN) tablet Take 1 tablet by mouth  daily.    triamcinolone  ointment (KENALOG ) 0.1 % Apply 1 application topically 2 (two) times daily. (Patient taking differently: Apply 1 application  topically 2 (two) times daily. Using PRN)    [DISCONTINUED] traZODone  (DESYREL ) 50 MG tablet Take 1 tablet (50 mg total) by mouth at bedtime as needed for sleep. 09/02/2024: not helpful   No facility-administered medications prior to visit.    ROS        Objective:     BP 110/70   Pulse 65   Temp 98.2 F (36.8 C) (Oral)   Ht 5' 7 (1.702 m)   Wt 213 lb (96.6 kg)   SpO2 99%   BMI 33.36 kg/m    Physical Exam Vitals and nursing note reviewed.  Constitutional:      General: He is not in acute distress.    Appearance: Normal appearance.  HENT:     Right Ear: Tympanic membrane normal.     Left Ear: Tympanic membrane normal.     Nose: Nose normal.     Mouth/Throat:     Mouth: Mucous membranes are moist.     Pharynx: Oropharynx is clear.  Eyes:     Extraocular Movements: Extraocular movements intact.  Neck:     Thyroid : No thyroid  tenderness.  Cardiovascular:     Rate  and Rhythm: Normal rate and regular rhythm.     Pulses:          Radial pulses are 2+ on the right side and 2+ on the left side.     Heart sounds: Normal heart sounds, S1 normal and S2 normal.  Pulmonary:     Effort: Pulmonary effort is normal.     Breath sounds: Normal breath sounds.  Abdominal:     General: Bowel sounds are normal.     Palpations: Abdomen is soft.     Tenderness: There is no abdominal tenderness.  Musculoskeletal:        General: Normal range of motion.     Cervical back: Normal range of motion.     Right lower leg: No edema.     Left lower leg: No edema.  Lymphadenopathy:     Cervical:     Right cervical: No superficial cervical adenopathy.    Left cervical: No superficial cervical adenopathy.  Skin:    General: Skin is warm and dry.  Neurological:     General: No focal deficit present.     Mental Status: He is alert. Mental  status is at baseline.  Psychiatric:        Mood and Affect: Mood normal.        Behavior: Behavior normal.        Thought Content: Thought content normal.        Judgment: Judgment normal.      No results found for any visits on 09/02/24.     Assessment & Plan:    Routine Health Maintenance and Physical Exam  Immunization History  Administered Date(s) Administered   DTaP 05/03/2002, 07/12/2002, 09/13/2002, 06/18/2003, 04/03/2006   HIB (PRP-OMP) 05/03/2002, 07/22/2002, 10/10/2002, 03/19/2003   HPV 9-valent 10/10/2016, 11/29/2016, 04/05/2017   Hepatitis B Apr 15, 2002, 04/01/2002, 09/13/2002   IPV 05/03/2002, 07/12/2002, 12/18/2002, 04/03/2006   Influenza Inj Mdck Quad With Preservative 11/06/2017   Influenza Split 09/30/2014   Influenza, Seasonal, Injecte, Preservative Fre 10/10/2016   MMR 03/19/2003, 04/28/2005   Meningococcal Conjugate 07/01/2014   PFIZER(Purple Top)SARS-COV-2 Vaccination 02/20/2020, 03/16/2020   Pneumococcal-Unspecified 05/03/2002, 09/13/2002   Tdap 03/15/2013   Varicella 03/19/2003    Health Maintenance  Topic Date Due   Meningococcal B Vaccine (1 of 2 - Standard) Never done   Hepatitis C Screening  Never done   COVID-19 Vaccine (3 - Pfizer risk series) 04/13/2020   Pneumococcal Vaccine (1 of 2 - PCV) 02/25/2021   DTaP/Tdap/Td (7 - Td or Tdap) 03/16/2023   Influenza Vaccine  03/11/2025 (Originally 07/12/2024)   Hepatitis B Vaccines 19-59 Average Risk  Completed   HPV VACCINES  Completed   HIV Screening  Completed    Discussed health benefits of physical activity, and encouraged him to engage in regular exercise appropriate for his age and condition.  Annual physical exam  Encounter for lipid screening for cardiovascular disease -     Lipid panel  Encounter for screening for metabolic disorder -     Comprehensive metabolic panel with GFR -     Hemoglobin A1c -     TSH + free T4  Screening for viral disease -     Hepatitis C  antibody  Screening for deficiency anemia -     CBC      Routine labs ordered.  HCM reviewed/discussed. Hep C today. Declines flu vaccine. Anticipatory guidance regarding healthy weight, lifestyle and choices given. Recommend healthy diet.  Recommend approximately 150 minutes/week of moderate intensity exercise.  Resistance training is good for building muscles and for bone health. Muscle mass helps to increase our metabolism and to burn more calories at rest.  Limit alcohol consumption: no more than one drink per day for women and 2 drinks per day for me. Recommend regular dental and vision exams. Always use seatbelt/lap and shoulder restraints. Recommend using smoke alarms and checking batteries at least twice a year. Recommend using sunscreen when outside. Agrees with plan of care discussed.  Questions answered.      Return in about 1 year (around 09/03/2025) for CPE with labs.     Darice JONELLE Brownie, FNP

## 2024-09-03 ENCOUNTER — Ambulatory Visit: Payer: Self-pay | Admitting: Family Medicine

## 2024-09-03 LAB — COMPREHENSIVE METABOLIC PANEL WITH GFR
ALT: 20 IU/L (ref 0–44)
AST: 20 IU/L (ref 0–40)
Albumin: 4.5 g/dL (ref 4.3–5.2)
Alkaline Phosphatase: 80 IU/L (ref 47–123)
BUN/Creatinine Ratio: 20 (ref 9–20)
BUN: 23 mg/dL — ABNORMAL HIGH (ref 6–20)
Bilirubin Total: 0.4 mg/dL (ref 0.0–1.2)
CO2: 19 mmol/L — ABNORMAL LOW (ref 20–29)
Calcium: 9.6 mg/dL (ref 8.7–10.2)
Chloride: 102 mmol/L (ref 96–106)
Creatinine, Ser: 1.16 mg/dL (ref 0.76–1.27)
Globulin, Total: 2.4 g/dL (ref 1.5–4.5)
Glucose: 89 mg/dL (ref 70–99)
Potassium: 4.3 mmol/L (ref 3.5–5.2)
Sodium: 139 mmol/L (ref 134–144)
Total Protein: 6.9 g/dL (ref 6.0–8.5)
eGFR: 91 mL/min/1.73 (ref >59)

## 2024-09-03 LAB — TSH+FREE T4
Free T4: 1.38 ng/dL (ref 0.82–1.77)
TSH: 0.819 u[IU]/mL (ref 0.450–4.500)

## 2024-09-03 LAB — LIPID PANEL
Chol/HDL Ratio: 3.3 ratio (ref 0.0–5.0)
Cholesterol, Total: 195 mg/dL (ref 100–199)
HDL: 60 mg/dL (ref >39)
LDL Chol Calc (NIH): 96 mg/dL (ref 0–99)
Triglycerides: 231 mg/dL — ABNORMAL HIGH (ref 0–149)
VLDL Cholesterol Cal: 39 mg/dL (ref 5–40)

## 2024-09-03 LAB — CBC
Hematocrit: 50.7 % (ref 37.5–51.0)
Hemoglobin: 16.3 g/dL (ref 13.0–17.7)
MCH: 29.7 pg (ref 26.6–33.0)
MCHC: 32.1 g/dL (ref 31.5–35.7)
MCV: 92 fL (ref 79–97)
Platelets: 298 x10E3/uL (ref 150–450)
RBC: 5.49 x10E6/uL (ref 4.14–5.80)
RDW: 12.5 % (ref 11.6–15.4)
WBC: 6.5 x10E3/uL (ref 3.4–10.8)

## 2024-09-03 LAB — HEMOGLOBIN A1C
Est. average glucose Bld gHb Est-mCnc: 111 mg/dL
Hgb A1c MFr Bld: 5.5 % (ref 4.8–5.6)

## 2024-09-03 LAB — HEPATITIS C ANTIBODY: Hep C Virus Ab: NONREACTIVE

## 2024-09-10 ENCOUNTER — Ambulatory Visit
Admission: RE | Admit: 2024-09-10 | Discharge: 2024-09-10 | Disposition: A | Discharge status: Home / Self Care | Attending: Family Medicine | Admitting: Family Medicine

## 2024-09-10 VITALS — BP 143/84 | HR 92 | Temp 98.5°F | Resp 17

## 2024-09-10 DIAGNOSIS — J111 Influenza due to unidentified influenza virus with other respiratory manifestations: Secondary | ICD-10-CM

## 2024-09-10 LAB — POC SARS CORONAVIRUS 2 AG -  ED: SARS Coronavirus 2 Ag: NEGATIVE

## 2024-09-10 NOTE — ED Provider Notes (Signed)
 Micheal Taylor    CSN: [id number] Arrival date & time: 09/10/24  1128      History   Chief Complaint Chief Complaint  Patient presents with   Fatigue   Generalized Body Aches   Nasal Congestion   Cough    HPI Micheal Taylor is a 23 y.o. male.   HPI 23 year old male presents with fatigue and bodyaches for 2 days.  PMH significant for obesity, insomnia, and asthma.  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 Authorizing Provider  albuterol  (VENTOLIN  HFA) 108 (90 Base) MCG/ACT inhaler Inhale 1-2 puffs into the lungs every 6 (six) hours as needed for wheezing or shortness of breath. 10/13/21   Wilkinson, Dana E, NP  cetirizine  (ZYRTEC  ALLERGY ) 10 MG tablet Take 1 tablet (10 mg total) by mouth at bedtime. 10/20/20 09/02/24  McClanahan, Kyra, NP  Cholecalciferol (VITAMIN D ) 125 MCG (5000 UT) CAPS Take by mouth.    [provider]  fluticasone  (FLONASE ) 50 MCG/ACT nasal spray Place 1-2 sprays into both nostrils 2 (two) times daily as needed for allergies or rhinitis. 03/23/21   Jeanine Knee, NP  ibuprofen  (ADVIL ) 800 MG tablet Take 1 tablet (800 mg total) by mouth every 8 (eight) hours as needed for moderate pain. 07/16/22    Maranda Jamee Jacob, MD  Multiple Vitamin (MULTIVITAMIN) tablet Take 1 tablet by mouth daily.    [provider]  triamcinolone  ointment (KENALOG ) 0.1 % Apply 1 application topically 2 (two) times daily. Patient taking differently: Apply 1 application  topically 2 (two) times daily. Using PRN 02/09/21   McClanahan, Kyra, NP    Family History Family History  Problem Relation Age of Onset   Hypertension Mother    Hypertension Maternal Grandmother    Dementia Maternal Grandfather    Alzheimer's disease Paternal Grandfather     Social History Social History   Tobacco Use   Smoking status: Never    Passive exposure: Never   Smokeless tobacco: Never  Vaping Use   Vaping status: Never Used  Substance Use Topics   Alcohol use: Yes    Alcohol/week: 3.0 standard drinks of alcohol    Types: 3 Shots of liquor per week   Drug use: Not Currently    Types: Marijuana     Allergies   Patient has no known allergies.   Review of Systems Review of Systems   Physical Exam Triage Vital Signs ED Triage Vitals  Encounter Vitals Group     BP      Girls Systolic BP Percentile      Girls Diastolic BP Percentile      Boys Systolic BP Percentile      Boys Diastolic BP Percentile  Pulse      Resp      Temp      Temp src      SpO2      Weight      Height      Head Circumference      Peak Flow      Pain Score      Pain Loc      Pain Education      Exclude from Growth Chart    No data found.  Updated Vital Signs BP (!) 143/84 (BP Location: Right Arm)   Pulse 92   Temp 98.5 F (36.9 C) (Oral)   Resp 17   SpO2 95%    Physical Exam Vitals and nursing note reviewed.  Constitutional:      Appearance: Normal appearance. He is normal weight.  HENT:     Head: Normocephalic and atraumatic.     Right Ear: Tympanic membrane, ear canal and external ear normal.     Left Ear: Tympanic membrane, ear canal and external ear normal.     Mouth/Throat:     Mouth: Mucous  membranes are moist.     Pharynx: Oropharynx is clear.  Eyes:     Extraocular Movements: Extraocular movements intact.     Pupils: Pupils are equal, round, and reactive to light.  Cardiovascular:     Rate and Rhythm: Normal rate and regular rhythm.     Pulses: Normal pulses.     Heart sounds: Normal heart sounds.  Pulmonary:     Effort: Pulmonary effort is normal.     Breath sounds: Normal breath sounds. No wheezing, rhonchi or rales.  Musculoskeletal:        General: Normal range of motion.  Skin:    General: Skin is warm and dry.  Neurological:     General: No focal deficit present.     Mental Status: He is alert and oriented to person, place, and time. Mental status is at baseline.  Psychiatric:        Mood and Affect: Mood normal.        Behavior: Behavior normal.      UC Treatments / Results  Labs (all labs ordered are listed, but only abnormal results are displayed) Labs Reviewed  POC SARS CORONAVIRUS 2 AG -  ED    EKG   Radiology No results found.  Procedures Procedures (including critical Taylor time)  Medications Ordered in UC Medications - No data to display  Initial Impression / Assessment and Plan / UC Course  I have reviewed the triage vital signs and the nursing notes.  Pertinent labs & imaging results that were available during my Taylor of the patient were reviewed by me and considered in my medical decision making (see chart for details).     MDM: 1.  Influenza-like illness-COVID-19 negative today. Advised patient COVID-19 test was negative today.  Encouraged to increase daily water intake to 64 ounces per day 7 days/week.  Advised if symptoms worsen and are unresolved please follow-up with your PCP or here for further evaluation.  Patient discharged home, hemodynamically stable. Final Clinical Impressions(s) / UC Diagnoses   Final diagnoses:  Influenza-like illness     Discharge Instructions      Advised patient COVID-19 test was negative  today.  Encouraged to increase daily water intake to 64 ounces per day 7 days/week.  Advised if symptoms worsen and are unresolved please follow-up with your PCP or here for further evaluation.  ED Prescriptions   None    PDMP not reviewed this encounter.   Teddy Sharper, FNP 09/10/24 1235

## 2024-09-10 NOTE — ED Triage Notes (Signed)
 Pt c/o fatigue, bodyaches, cough and congestion since Sunday. Dayquil/Nyquil and theraflu prn.

## 2024-09-10 NOTE — Discharge Instructions (Addendum)
 Advised patient COVID-19 test was negative today.  Encouraged to increase daily water intake to 64 ounces per day 7 days/week.  Advised if symptoms worsen and are unresolved please follow-up with your PCP or here for further evaluation.

## 2024-09-11 ENCOUNTER — Telehealth: Payer: Self-pay

## 2024-09-11 NOTE — Telephone Encounter (Signed)
Called to check on patient. No answer. Left voicemail.

## 2025-09-03 ENCOUNTER — Ambulatory Visit: Admitting: Family Medicine
